# Patient Record
Sex: Female | Born: 1980 | Race: Black or African American | Hispanic: No | Marital: Single | Smoking: Never smoker
Health system: Southern US, Community
[De-identification: ages and names within clinical notes are randomized; demographics above are authoritative.]

## PROBLEM LIST (undated history)

## (undated) DIAGNOSIS — D649 Anemia, unspecified: Secondary | ICD-10-CM

## (undated) DIAGNOSIS — D219 Benign neoplasm of connective and other soft tissue, unspecified: Secondary | ICD-10-CM

## (undated) DIAGNOSIS — Z973 Presence of spectacles and contact lenses: Secondary | ICD-10-CM

## (undated) HISTORY — DX: Anemia, unspecified: D64.9

## (undated) HISTORY — PX: MYOMECTOMY: SHX85

---

## 2012-01-24 DIAGNOSIS — N920 Excessive and frequent menstruation with regular cycle: Secondary | ICD-10-CM | POA: Insufficient documentation

## 2015-02-20 ENCOUNTER — Encounter: Payer: Self-pay | Admitting: *Deleted

## 2015-02-24 NOTE — Discharge Instructions (Signed)
[facility] °[facility] ° °POST OPERATIVE INSTRUCTIONS FOR DR. TROXLER AND DR. FOWLER °[facility] ° ° °1. Take your medication as prescribed.  Pain medication should be taken only as needed. ° °2. Keep the dressing clean, dry and intact. ° °3. Keep your foot elevated above the heart level for the first 48 hours. ° °4. Walking to the bathroom and brief periods of walking are acceptable, unless we have instructed you to be non-weight bearing. ° °5. Always wear your post-op shoe when walking.  Always use your crutches if you are to be non-weight bearing. ° °6. Do not take a shower. Baths are permissible as long as the foot is kept out of the water.  ° °7. Every hour you are awake:  °- Bend your knee 15 times. °- Flex foot 15 times °- Massage calf 15 times ° °8. Call [facility] ([phone]) if any of the following problems occur: °- You develop a temperature or fever. °- The bandage becomes saturated with blood. °- Medication does not stop your pain. °- Injury of the foot occurs. °- Any symptoms of infection including redness, odor, or red streaks running from wound. ° °General Anesthesia, Adult, Care After °Refer to this sheet in the next few weeks. These instructions provide you with information on caring for yourself after your procedure. Your health care provider may also give you more specific instructions. Your treatment has been planned according to current medical practices, but problems sometimes occur. Call your health care provider if you have any problems or questions after your procedure. °WHAT TO EXPECT AFTER THE PROCEDURE °After the procedure, it is typical to experience: °· Sleepiness. °· Nausea and vomiting. °HOME CARE INSTRUCTIONS °· For the first 24 hours after general anesthesia: °¨ Have a responsible person with you. °¨ Do not drive a car. If you are alone, do not take public transportation. °¨ Do not drink alcohol. °¨ Do not take  medicine that has not been prescribed by your health care provider. °¨ Do not sign important papers or make important decisions. °¨ You may resume a normal diet and activities as directed by your health care provider. °· Change bandages (dressings) as directed. °· If you have questions or problems that seem related to general anesthesia, call the hospital and ask for the anesthetist or anesthesiologist on call. °SEEK MEDICAL CARE IF: °· You have nausea and vomiting that continue the day after anesthesia. °· You develop a rash. °SEEK IMMEDIATE MEDICAL CARE IF:  °· You have difficulty breathing. °· You have chest pain. °· You have any allergic problems. °  °This information is not intended to replace advice given to you by your health care provider. Make sure you discuss any questions you have with your health care provider. °  °Document Released: 05/16/2000 Document Revised: 02/28/2014 Document Reviewed: 06/08/2011 °Elsevier Interactive Patient Education ©2016 Elsevier Inc. ° °

## 2015-02-25 ENCOUNTER — Ambulatory Visit: Payer: Worker's Compensation | Admitting: Anesthesiology

## 2015-02-25 ENCOUNTER — Ambulatory Visit
Admission: RE | Admit: 2015-02-25 | Discharge: 2015-02-25 | Disposition: A | Discharge status: Home / Self Care | Payer: Worker's Compensation | Source: Ambulatory Visit | Attending: Podiatry | Admitting: Podiatry

## 2015-02-25 ENCOUNTER — Encounter
Admission: RE | Disposition: A | Discharge status: Home / Self Care | Payer: Self-pay | Source: Ambulatory Visit | Attending: Podiatry

## 2015-02-25 DIAGNOSIS — Y939 Activity, unspecified: Secondary | ICD-10-CM | POA: Diagnosis not present

## 2015-02-25 DIAGNOSIS — J3081 Allergic rhinitis due to animal (cat) (dog) hair and dander: Secondary | ICD-10-CM | POA: Diagnosis not present

## 2015-02-25 DIAGNOSIS — Z79899 Other long term (current) drug therapy: Secondary | ICD-10-CM | POA: Insufficient documentation

## 2015-02-25 DIAGNOSIS — X58XXXA Exposure to other specified factors, initial encounter: Secondary | ICD-10-CM | POA: Insufficient documentation

## 2015-02-25 DIAGNOSIS — S82832A Other fracture of upper and lower end of left fibula, initial encounter for closed fracture: Secondary | ICD-10-CM | POA: Insufficient documentation

## 2015-02-25 HISTORY — DX: Presence of spectacles and contact lenses: Z97.3

## 2015-02-25 HISTORY — PX: ORIF ANKLE FRACTURE: SHX5408

## 2015-02-25 SURGERY — OPEN REDUCTION INTERNAL FIXATION (ORIF) ANKLE FRACTURE
Anesthesia: Regional | Site: Ankle | Laterality: Left | Wound class: Clean

## 2015-02-25 MED ORDER — ONDANSETRON HCL 4 MG PO TABS: 4.0000 mg | ORAL_TABLET | Freq: Four times a day (QID) | ORAL | Status: DC | PRN

## 2015-02-25 MED ORDER — ONDANSETRON HCL 4 MG/2ML IJ SOLN
INTRAMUSCULAR | Status: DC | PRN
  Administered 2015-02-25: 4 mg via INTRAVENOUS

## 2015-02-25 MED ORDER — HYDROMORPHONE HCL 1 MG/ML IJ SOLN: 0.2500 mg | INTRAMUSCULAR | Status: DC | PRN

## 2015-02-25 MED ORDER — ONDANSETRON HCL 4 MG/2ML IJ SOLN: 4.0000 mg | Freq: Once | INTRAMUSCULAR | Status: DC | PRN

## 2015-02-25 MED ORDER — HYDROCODONE-ACETAMINOPHEN 5-325 MG PO TABS: 1.0000 | ORAL_TABLET | ORAL | Status: DC | PRN

## 2015-02-25 MED ORDER — ACETAMINOPHEN 325 MG PO TABS: 325.0000 mg | ORAL_TABLET | ORAL | Status: DC | PRN

## 2015-02-25 MED ORDER — LACTATED RINGERS IV SOLN
INTRAVENOUS | Status: DC
  Administered 2015-02-25 (×2): via INTRAVENOUS

## 2015-02-25 MED ORDER — DEXAMETHASONE SODIUM PHOSPHATE 4 MG/ML IJ SOLN
INTRAMUSCULAR | Status: DC | PRN
  Administered 2015-02-25: 4 mg via PERINEURAL
  Administered 2015-02-25: 8 mg via INTRAVENOUS

## 2015-02-25 MED ORDER — OXYCODONE HCL 5 MG/5ML PO SOLN: 5.0000 mg | Freq: Once | ORAL | Status: AC | PRN

## 2015-02-25 MED ORDER — ACETAMINOPHEN 160 MG/5ML PO SOLN: 325.0000 mg | ORAL | Status: DC | PRN

## 2015-02-25 MED ORDER — OXYCODONE HCL 5 MG PO TABS
5.0000 mg | ORAL_TABLET | Freq: Once | ORAL | Status: AC | PRN
  Administered 2015-02-25: 5 mg via ORAL

## 2015-02-25 MED ORDER — BUPIVACAINE HCL (PF) 0.25 % IJ SOLN
INTRAMUSCULAR | Status: DC | PRN
  Administered 2015-02-25: 10 mL

## 2015-02-25 MED ORDER — MIDAZOLAM HCL 2 MG/2ML IJ SOLN
INTRAMUSCULAR | Status: DC | PRN
  Administered 2015-02-25: 2 mg via INTRAVENOUS

## 2015-02-25 MED ORDER — ONDANSETRON HCL 4 MG/2ML IJ SOLN: 4.0000 mg | Freq: Four times a day (QID) | INTRAMUSCULAR | Status: DC | PRN

## 2015-02-25 MED ORDER — LIDOCAINE HCL (CARDIAC) 20 MG/ML IV SOLN
INTRAVENOUS | Status: DC | PRN
Start: 2015-02-25 — End: 2015-02-25
  Administered 2015-02-25: 40 mg via INTRATRACHEAL

## 2015-02-25 MED ORDER — CEFAZOLIN SODIUM-DEXTROSE 2-3 GM-% IV SOLR
2.0000 g | Freq: Once | INTRAVENOUS | Status: AC
  Administered 2015-02-25: 2 g via INTRAVENOUS

## 2015-02-25 MED ORDER — PROPOFOL 10 MG/ML IV BOLUS
INTRAVENOUS | Status: DC | PRN
  Administered 2015-02-25: 200 mg via INTRAVENOUS

## 2015-02-25 MED ORDER — ROPIVACAINE HCL 5 MG/ML IJ SOLN
INTRAMUSCULAR | Status: DC | PRN
  Administered 2015-02-25: 15 mL via EPIDURAL
  Administered 2015-02-25: 35 mL via PERINEURAL

## 2015-02-25 MED ORDER — FENTANYL CITRATE (PF) 100 MCG/2ML IJ SOLN
INTRAMUSCULAR | Status: DC | PRN
  Administered 2015-02-25 (×2): 50 ug via INTRAVENOUS
  Administered 2015-02-25: 100 ug via INTRAVENOUS

## 2015-02-25 SURGICAL SUPPLY — 48 items
BAG SNAP BAND KOVER 36X36 (MISCELLANEOUS) ×2 IMPLANT
BANDAGE ELASTIC 4 CLIP NS LF (GAUZE/BANDAGES/DRESSINGS) ×2 IMPLANT
BIT DRILL 2.5X2.75 QC CALB (BIT) ×2 IMPLANT
BIT DRILL CALIBRATED 2.7 (BIT) ×2 IMPLANT
BNDG ESMARK 4X12 TAN STRL LF (GAUZE/BANDAGES/DRESSINGS) ×2 IMPLANT
BNDG GAUZE 4.5X4.1 6PLY STRL (MISCELLANEOUS) ×2 IMPLANT
BNDG STRETCH 4X75 STRL LF (GAUZE/BANDAGES/DRESSINGS) ×2 IMPLANT
CANISTER SUCT 1200ML W/VALVE (MISCELLANEOUS) ×2 IMPLANT
COVER LIGHT HANDLE UNIVERSAL (MISCELLANEOUS) ×4 IMPLANT
CUFF TOURN SGL QUICK 34 (TOURNIQUET CUFF) ×1
CUFF TRNQT CYL 34X4X40X1 (TOURNIQUET CUFF) ×1 IMPLANT
DRAPE SHEET LG 3/4 BI-LAMINATE (DRAPES) ×2 IMPLANT
DURAPREP 26ML APPLICATOR (WOUND CARE) ×2 IMPLANT
GAUZE PETRO XEROFOAM 1X8 (MISCELLANEOUS) ×2 IMPLANT
GAUZE SPONGE 4X4 12PLY STRL (GAUZE/BANDAGES/DRESSINGS) ×2 IMPLANT
GLOVE BIO SURGEON STRL SZ7.5 (GLOVE) ×4 IMPLANT
GLOVE INDICATOR 8.0 STRL GRN (GLOVE) ×4 IMPLANT
GOWN STRL REUS W/ TWL LRG LVL3 (GOWN DISPOSABLE) ×2 IMPLANT
GOWN STRL REUS W/TWL LRG LVL3 (GOWN DISPOSABLE) ×2
KIT ROOM TURNOVER OR (KITS) ×2 IMPLANT
NS IRRIG 500ML POUR BTL (IV SOLUTION) ×2 IMPLANT
PACK EXTREMITY ARMC (MISCELLANEOUS) ×2 IMPLANT
PAD GROUND ADULT SPLIT (MISCELLANEOUS) ×2 IMPLANT
PLATE LOCK 7H 92 BILAT FIB (Plate) ×2 IMPLANT
SCREW CORTICAL 2.7MM  38MM (Screw) ×1 IMPLANT
SCREW CORTICAL 2.7MM  42MM (Screw) IMPLANT
SCREW CORTICAL 2.7MM 38MM (Screw) ×1 IMPLANT
SCREW CORTICAL 2.7MM 42MM (Screw) IMPLANT
SCREW LOCK CORT STAR 3.5X10 (Screw) ×2 IMPLANT
SCREW LOCK CORT STAR 3.5X12 (Screw) ×4 IMPLANT
SCREW LOCK CORT STAR 3.5X14 (Screw) ×2 IMPLANT
SCREW LOCK CORT STAR 3.5X16 (Screw) ×2 IMPLANT
SCREW LOW PROFILE 18MMX3.5MM (Screw) IMPLANT
SCREW NON LOCKING LP 3.5 16MM (Screw) ×2 IMPLANT
SPONGE LAP 18X18 5 PK (GAUZE/BANDAGES/DRESSINGS) ×2 IMPLANT
STOCKINETTE STRL 6IN 960660 (GAUZE/BANDAGES/DRESSINGS) ×2 IMPLANT
STRAP BODY AND KNEE 60X3 (MISCELLANEOUS) ×4 IMPLANT
SUT ETHILON 3-0 KS 30 BLK (SUTURE) ×4 IMPLANT
SUT ETHILON 4-0 (SUTURE)
SUT ETHILON 4-0 FS2 18XMFL BLK (SUTURE)
SUT ETHILON 5-0 FS-2 18 BLK (SUTURE) IMPLANT
SUT MNCRL+ 5-0 UNDYED PC-3 (SUTURE) IMPLANT
SUT MONOCRYL 5-0 (SUTURE)
SUT VIC AB 2-0 CT2 27 (SUTURE) ×2 IMPLANT
SUT VIC AB 4-0 FS2 27 (SUTURE) ×2 IMPLANT
SUT VICRYL 3-0 27IN (SUTURE) ×2 IMPLANT
SUT VICRYL AB 3-0 FS1 BRD 27IN (SUTURE) IMPLANT
SUTURE ETHLN 4-0 FS2 18XMF BLK (SUTURE) IMPLANT

## 2015-02-25 NOTE — Transfer of Care (Signed)
Immediate Anesthesia Transfer of Care Note  Patient: Sierra Mejia  Procedure(s) Performed: Procedure(s) with comments: OPEN REDUCTION INTERNAL FIXATION (ORIF) ANKLE FRACTURE LEFT FIBULAR (Left) - WITH POPLITEAL  Patient Location: PACU  Anesthesia Type: General, Regional  Level of Consciousness: awake, alert  and patient cooperative  Airway and Oxygen Therapy: Patient Spontanous Breathing and Patient connected to supplemental oxygen  Post-op Assessment: Post-op Vital signs reviewed, Patient's Cardiovascular Status Stable, Respiratory Function Stable, Patent Airway and No signs of Nausea or vomiting  Post-op Vital Signs: Reviewed and stable  Complications: No apparent anesthesia complications

## 2015-02-25 NOTE — Progress Notes (Signed)
Assisted Daniel Kovacs ANMD with left, popliteal/saphenous block. Side rails up, monitors on throughout procedure. See vital signs in flow sheet. Tolerated Procedure well.    

## 2015-02-25 NOTE — Anesthesia Procedure Notes (Addendum)
Anesthesia Regional Block:  Popliteal block  Pre-Anesthetic Checklist: ,, timeout performed, Correct Patient, Correct Site, Correct Laterality, Correct Procedure, Correct Position, site marked, Risks and benefits discussed,  Surgical consent,  Pre-op evaluation,  At surgeon's request and post-op pain management  Laterality: Left  Prep: chloraprep       Needles:  Injection technique: Single-shot     Needle Length: 4cm 4 cm Needle Gauge: 21 and 21 G    Additional Needles:  Procedures: ultrasound guided (picture in chart) Popliteal block Narrative:  Start time: 02/25/2015 11:00 AM End time: 02/25/2015 11:15 AM Injection made incrementally with aspirations every 35 mL.  Performed by: Personally  Anesthesiologist: Marchia Bond D   Anesthesia Regional Block:  Adductor canal block  Pre-Anesthetic Checklist: ,, timeout performed, Correct Patient, Correct Site, Correct Laterality, Correct Procedure, Correct Position, site marked, Risks and benefits discussed,  Surgical consent,  Pre-op evaluation,  At surgeon's request and post-op pain management  Laterality: Left  Prep: chloraprep       Needles:      Needle Length: 4cm 4 cm Needle Gauge: 21 and 21 G    Additional Needles: Adductor canal block Narrative:  Start time: 02/25/2015 11:00 AM End time: 02/25/2015 11:15 AM Injection made incrementally with aspirations every 15 mL.  Performed by: Personally  Anesthesiologist: Marchia Bond D   Procedure Name: LMA Insertion Date/Time: 02/25/2015 12:27 PM Performed by: Londell Moh Pre-anesthesia Checklist: Patient identified, Emergency Drugs available, Suction available, Timeout performed and Patient being monitored Patient Re-evaluated:Patient Re-evaluated prior to inductionOxygen Delivery Method: Circle system utilized Preoxygenation: Pre-oxygenation with 100% oxygen Intubation Type: IV induction LMA: LMA inserted LMA Size: 4.0 Number of attempts: 1 Placement  Confirmation: positive ETCO2 and breath sounds checked- equal and bilateral Tube secured with: Tape

## 2015-02-25 NOTE — Anesthesia Postprocedure Evaluation (Signed)
Anesthesia Post Note  Patient: Sierra Mejia  Procedure(s) Performed: Procedure(s) (LRB): OPEN REDUCTION INTERNAL FIXATION (ORIF) ANKLE FRACTURE LEFT FIBULAR (Left)  Patient location during evaluation: PACU Anesthesia Type: General and Regional Level of consciousness: awake and alert Pain management: pain level controlled Vital Signs Assessment: post-procedure vital signs reviewed and stable Respiratory status: spontaneous breathing, nonlabored ventilation, respiratory function stable and patient connected to nasal cannula oxygen Cardiovascular status: blood pressure returned to baseline and stable Postop Assessment: no signs of nausea or vomiting Anesthetic complications: no    Khalilah Hoke D Alizzon Dioguardi

## 2015-02-25 NOTE — H&P (Signed)
  HISTORY AND PHYSICAL INTERVAL NOTE:  02/25/2015  12:17 PM  Sierra Mejia  has presented today for surgery, with the diagnosis of S82.832A CLOSED FRACTURE DISTAL END OF LEFT FIBULA.  The various methods of treatment have been discussed with the patient.  No guarantees were given.  After consideration of risks, benefits and other options for treatment, the patient has consented to surgery.  I have reviewed the patients' chart and labs.    Patient Vitals for the past 24 hrs:  BP Temp Pulse Resp SpO2 Height Weight  02/25/15 1115 - - 73 17 100 % - -  02/25/15 1110 129/80 mmHg - 74 (!) 21 100 % - -  02/25/15 1105 128/80 mmHg - 75 20 100 % - -  02/25/15 1100 136/84 mmHg - 73 16 100 % - -  02/25/15 1051 (!) 138/92 mmHg 97.7 F (36.5 C) 84 16 100 % 5\' 11"  (1.803 m) 113.399 kg (250 lb)    A history and physical examination was performed in my office.  The patient was reexamined.  There have been no changes to this history and physical examination.Plan for ORIF left fibula fracture.  Samara Deist A

## 2015-02-25 NOTE — Op Note (Signed)
Operative note   Surgeon:Lalah Durango    Assistant:none    Preop diagnosis:Left fibular fracture     Postop diagnosis:Same     Procedure:ORIF left fibular fracture    CH:[id number]    Anesthesia:regional and general    Hemostasis:Thigh and inflated to 325 mmHg for 92 minutes    Specimen: None    Complications: None    Operative indications: 35 year old female sustained a fibular fracture. She noted marked instability with pain and a sensation of case and of the ankle. We discussed nonsurgical and surgical treatment and she presents today for surgery. The risks benefits alternatives and competitions have been discussed and consent has been given.    Procedure:  Patient was brought into the OR and placed on the operating table in thesupine position. After anesthesia was obtained theleft lower extremity was prepped and draped in usual sterile fashion.  Attention was directed to the lateral aspect of the fibula where after inflation of the tourniquet longitudinal incision was made to the distal one third of the fibula. Sharp and blunt dissection carried down to the periosteum. The subperiosteal dissection was then undertaken. Posterior oblique fracture was noted and with bone reduction forceps I was able to realign this. Prior to reduction the ankle joint was x-rayed and there was noted to be a increased medial gap on the medial gutter region. After compression of the fibular fracture and alignment the medial gap was noted to be anatomic. There was also noted to be a secondary large fragment on the anterior aspect of the fibula. Possibly a large exostosis that had fractured. I. The posterior oblique fracture was stabilized with bone reduction clamp and a Biomet composite locking plate was applied to the lateral aspect of the fibula. 3 holes proximal were filled with 2 locking 3.48mm screws and one 3.64mm nonlocking screw. The distal holes were filled with 3.5 mm locking screws. At this time the  large anterior osteophyte fragment was noted and was temporarily reduced with a bone reduction clamp. A 2.7 mm x 38 mm screw was placed from anterior to posterior. Good alignment and stabilization was noted at this time. Multiple views of fluoroscopy revealed anatomic aligned ankle fracture site and the ankle joint itself was anatomically aligned without stability under stress. The wound was then flushed with copious amounts of irrigation. Layered closure was performed with a 2-0 Vicryl and 3-0 Vicryl. The skin was closed with a 3-0 nylon. Patient was then placed in a well compressive bulky sterile dressing and a equalizer walker boot was applied.    Patient tolerated the procedure and anesthesia well.  Was transported from the OR to the PACU with all vital signs stable and vascular status intact. To be discharged per routine protocol.  Will follow up in approximately 1 week in the outpatient clinic.

## 2015-02-25 NOTE — Anesthesia Preprocedure Evaluation (Addendum)
Anesthesia Evaluation  Patient identified by MRN, date of birth, ID band Patient awake    Reviewed: Allergy & Precautions, H&P , NPO status , Patient's Chart, lab work & pertinent test results, reviewed documented beta blocker date and time   Airway Mallampati: II  TM Distance: >3 FB Neck ROM: full    Dental no notable dental hx.    Pulmonary neg pulmonary ROS,    Pulmonary exam normal breath sounds clear to auscultation       Cardiovascular Exercise Tolerance: Good negative cardio ROS   Rhythm:regular Rate:Normal     Neuro/Psych negative neurological ROS  negative psych ROS   GI/Hepatic negative GI ROS, Neg liver ROS,   Endo/Other  negative endocrine ROS  Renal/GU negative Renal ROS  negative genitourinary   Musculoskeletal   Abdominal   Peds  Hematology negative hematology ROS (+)   Anesthesia Other Findings   Reproductive/Obstetrics negative OB ROS                             Anesthesia Physical Anesthesia Plan  ASA: II  Anesthesia Plan: General and Regional   Post-op Pain Management:    Induction:   Airway Management Planned:   Additional Equipment:   Intra-op Plan:   Post-operative Plan:   Informed Consent: I have reviewed the patients History and Physical, chart, labs and discussed the procedure including the risks, benefits and alternatives for the proposed anesthesia with the patient or authorized representative who has indicated his/her understanding and acceptance.     Plan Discussed with: CRNA  Anesthesia Plan Comments:         Anesthesia Quick Evaluation

## 2015-02-26 ENCOUNTER — Encounter: Payer: Self-pay | Admitting: Podiatry

## 2015-11-11 DIAGNOSIS — Z30431 Encounter for routine checking of intrauterine contraceptive device: Secondary | ICD-10-CM | POA: Insufficient documentation

## 2016-08-15 ENCOUNTER — Encounter (HOSPITAL_COMMUNITY): Payer: Self-pay | Admitting: *Deleted

## 2016-08-15 ENCOUNTER — Inpatient Hospital Stay (HOSPITAL_COMMUNITY)
Admission: AD | Admit: 2016-08-15 | Discharge: 2016-08-15 | Disposition: A | Discharge status: Home / Self Care | Payer: 59 | Source: Ambulatory Visit | Attending: Obstetrics and Gynecology | Admitting: Obstetrics and Gynecology

## 2016-08-15 DIAGNOSIS — N939 Abnormal uterine and vaginal bleeding, unspecified: Secondary | ICD-10-CM | POA: Diagnosis not present

## 2016-08-15 DIAGNOSIS — D259 Leiomyoma of uterus, unspecified: Secondary | ICD-10-CM | POA: Diagnosis not present

## 2016-08-15 DIAGNOSIS — R5383 Other fatigue: Secondary | ICD-10-CM | POA: Diagnosis present

## 2016-08-15 DIAGNOSIS — D649 Anemia, unspecified: Secondary | ICD-10-CM | POA: Diagnosis not present

## 2016-08-15 DIAGNOSIS — N926 Irregular menstruation, unspecified: Secondary | ICD-10-CM | POA: Diagnosis present

## 2016-08-15 LAB — URINALYSIS, MICROSCOPIC (REFLEX)
Bacteria, UA: NONE SEEN
RBC / HPF: NONE SEEN RBC/hpf (ref 0–5)

## 2016-08-15 LAB — CBC
HEMATOCRIT: 23.7 % — AB (ref 36.0–46.0)
HEMOGLOBIN: 7 g/dL — AB (ref 12.0–15.0)
MCH: 21.5 pg — AB (ref 26.0–34.0)
MCHC: 29.5 g/dL — ABNORMAL LOW (ref 30.0–36.0)
MCV: 72.7 fL — AB (ref 78.0–100.0)
Platelets: 410 10*3/uL — ABNORMAL HIGH (ref 150–400)
RBC: 3.26 MIL/uL — ABNORMAL LOW (ref 3.87–5.11)
RDW: 18.7 % — ABNORMAL HIGH (ref 11.5–15.5)
WBC: 4.1 10*3/uL (ref 4.0–10.5)

## 2016-08-15 LAB — URINALYSIS, ROUTINE W REFLEX MICROSCOPIC
Bilirubin Urine: NEGATIVE
GLUCOSE, UA: NEGATIVE mg/dL
Ketones, ur: NEGATIVE mg/dL
Leukocytes, UA: NEGATIVE
Nitrite: NEGATIVE
Protein, ur: NEGATIVE mg/dL
SPECIFIC GRAVITY, URINE: 1.02 (ref 1.005–1.030)
pH: 6 (ref 5.0–8.0)

## 2016-08-15 LAB — POCT PREGNANCY, URINE: Preg Test, Ur: NEGATIVE

## 2016-08-15 MED ORDER — SODIUM CHLORIDE 0.9 % IV SOLN
510.0000 mg | INTRAVENOUS | Status: DC
  Filled 2016-08-15: qty 17

## 2016-08-15 MED ORDER — MEGESTROL ACETATE 40 MG PO TABS: 40.0000 mg | ORAL_TABLET | Freq: Two times a day (BID) | ORAL | 5 refills | Status: DC

## 2016-08-15 MED ORDER — TRANEXAMIC ACID 650 MG PO TABS: 1300.0000 mg | ORAL_TABLET | Freq: Three times a day (TID) | ORAL | 2 refills | Status: DC

## 2016-08-15 MED ORDER — FERROUS SULFATE 325 (65 FE) MG PO TABS: 325.0000 mg | ORAL_TABLET | Freq: Three times a day (TID) | ORAL | 3 refills | Status: DC

## 2016-08-15 NOTE — MAU Provider Note (Addendum)
[facility] Attending Note  History     CSN: [id number]  Arrival date & time 08/15/16  1761   First Provider Initiated Contact with Patient 08/15/16 2207      Chief Complaint  Patient presents with  . Fatigue    Sierra Mejia is a 36 y.o. G0P0000 who presents to MAU today for evaluation of "feeling anemic". Reports long history of fibroids and AUB; had one transfusion in the past when her hemoglobin was 5.  Had myomectomy in [place] in 2014. No current GYN doctor, moved to [place] 2 years ago. Has been seen by GYN in [place], declined surgical management.  Has heavy, irregular periods, last one ended yesterday. No current bleeding. Endorses lightheadedness, and fatigue.  Denies any abnormal vaginal discharge, fevers, chills, sweats, dysuria, nausea, vomiting, other GI or GU symptoms or other general symptoms.   Obstetric History   G0   P0   T0   P0   A0   L0    SAB0   TAB0   Ectopic0   Multiple0   Live Births0       Past Medical History:  Diagnosis Date  . Wears contact lenses     Past Surgical History:  Procedure Laterality Date  . MYOMECTOMY    . ORIF ANKLE FRACTURE Left 02/25/2015   Procedure: OPEN REDUCTION INTERNAL FIXATION (ORIF) ANKLE FRACTURE LEFT FIBULAR;  Surgeon: Samara Deist, DPM;  Location: [facility];  Service: Podiatry;  Laterality: Left;  WITH POPLITEAL    No family history on file.  Social History  Substance Use Topics  . Smoking status: Never Smoker  . Smokeless tobacco: Not on file  . Alcohol use No    No Known Allergies  No prescriptions prior to admission.     Physical Exam  BP 122/75 (BP Location: Right Arm)   Pulse 76   Temp 98 F (36.7 C) (Oral)   Resp 18   Ht 5\' 11"  (1.803 m)   Wt 267 lb 4 oz (121.2 kg)   LMP 08/09/2016   BMI 37.27 kg/m  GENERAL: Well-developed, well-nourished female in no acute distress  SKIN: Warm, dry and without erythema PSYCH: Normal mood and affect HEENT: Normocephalic,  atraumatic.   LUNGS: Normal respiratory effort, normal breath sounds HEART: Regular rate noted ABDOMEN: Soft, nondistended, nontender, enlarged fibroid uterus  PELVIC: Deferred EXTREMITIES: No edema, no cyanosis, normal range of movement  MAU Course/MDM  No intervention given in MAU  Labs and Imaging   12/11/2015 at [facility] ADDITIONAL CLINICAL INFORMATION: Uterine fibroids COMPARISON: MRI February 2017 INTERPRETATION:  The uterus is enlarged measured at 9.2 x 14.4 x 20.6 cm. Multiple fibroids are noted throughout the uterus with the largest measuring 9.1 cm.  The endometrial stripe is normal in thickness. Right ovary is not visualized. Left ovary is not visualized. There is no significant free fluid. IMPRESSION:  Enlarged fibroid uterus.     Results for orders placed or performed during the hospital encounter of 08/15/16 (from the past 24 hour(s))  Urinalysis, Routine w reflex microscopic     Status: Abnormal   Collection Time: 08/15/16  8:19 PM  Result Value Ref Range   Color, Urine YELLOW YELLOW   APPearance CLEAR CLEAR   Specific Gravity, Urine 1.020 1.005 - 1.030   pH 6.0 5.0 - 8.0   Glucose, UA NEGATIVE NEGATIVE mg/dL   Hgb urine dipstick TRACE (A) NEGATIVE   Bilirubin Urine NEGATIVE NEGATIVE  Ketones, ur NEGATIVE NEGATIVE mg/dL   Protein, ur NEGATIVE NEGATIVE mg/dL   Nitrite NEGATIVE NEGATIVE   Leukocytes, UA NEGATIVE NEGATIVE  Urinalysis, Microscopic (reflex)     Status: Abnormal   Collection Time: 08/15/16  8:19 PM  Result Value Ref Range   RBC / HPF NONE SEEN 0 - 5 RBC/hpf   WBC, UA 0-5 0 - 5 WBC/hpf   Bacteria, UA NONE SEEN NONE SEEN   Squamous Epithelial / LPF 0-5 (A) NONE SEEN   Mucous PRESENT   Pregnancy, urine POC     Status: None   Collection Time: 08/15/16  8:22 PM  Result Value Ref Range   Preg Test, Ur NEGATIVE NEGATIVE  CBC     Status: Abnormal   Collection Time: 08/15/16  8:25 PM  Result  Value Ref Range   WBC 4.1 4.0 - 10.5 K/uL   RBC 3.26 (L) 3.87 - 5.11 MIL/uL   Hemoglobin 7.0 (L) 12.0 - 15.0 g/dL   HCT 23.7 (L) 36.0 - 46.0 %   MCV 72.7 (L) 78.0 - 100.0 fL   MCH 21.5 (L) 26.0 - 34.0 pg   MCHC 29.5 (L) 30.0 - 36.0 g/dL   RDW 18.7 (H) 11.5 - 15.5 %   Platelets 410 (H) 150 - 400 K/uL   No results found.  Assessment and Plan   1. Anemia, unspecified type   2. Abnormal uterine bleeding (AUB)   3. Uterine leiomyoma, unspecified location    Patient is anemic with a hemoglobin of 7.0 and symptomatic. Offered transfusion, she declined. Discussed outpatient IV iron infusion; she agreed to this.  Orders were signed and held, and I scheduled an appointment with the [facility], [place] 3rd floor) at 11 am. Ordered two weekly doses of Feraheme.  Patient agreed with this plan.  Work letter was given to her.   As per long term management,  aad a long discussion about fertility sparing management options for abnormal uterine bleeding including tranexamic acid (Lysteda), oral progesterone (Megace), Depo Provera, Mirena IUD, or myomectomy for surgical management.  Discussed risks and benefits of each method.   Patient desires Marshell Levan for now, considering Megace.  Lysteda and Megace prescribed as needed for now,  bleeding precautions reviewed.     Was told to return to MAU for any pain, bleeding or other concerns, or if her condition were to change or worsen. Was also given list of GYN providers and told to establish care ASAP.  Discharged to home in stable condition   Allergies as of 08/15/2016   No Known Allergies     Medication List    TAKE these medications   ferrous sulfate 325 (65 FE) MG tablet Take 1 tablet (325 mg total) by mouth 3 (three) times daily with meals. What changed:  when to take this   HYDROcodone-acetaminophen 5-325 MG tablet Commonly known as:  NORCO/VICODIN Take 2 tablets by mouth every 6 (six) hours as needed for  moderate pain.   megestrol 40 MG tablet Commonly known as:  MEGACE Take 1 tablet (40 mg total) by mouth 2 (two) times daily. Can increase to two tablets twice a day in the event of heavy bleeding   tranexamic acid 650 MG Tabs tablet Commonly known as:  LYSTEDA Take 2 tablets (1,300 mg total) by mouth 3 (three) times daily. Take during menses for a maximum of five days        Verita Schneiders, MD, [facility] Attending Obstetrician &  Gynecologist, Product/process development scientist for [employer], [place]

## 2016-08-15 NOTE — MAU Note (Signed)
PT  SAYS SHE FEELS  WEAK  AND  HAS ANEMIA  AND FIBROIDS.    HER CYCLE-  HAS BEEN X2 IN  April AND  MAY .   HER DR IN [place] TOLD HER THIS.  HAS DR IN [place]-  HAD SURGERY IN 2014- [place]   .   NO BIRTH CONTROL.  LAST SEX-  1 MTH AGO.

## 2016-08-15 NOTE — Discharge Instructions (Signed)
[place] for [employer] at [facility]       Phone: [phone]  [place] for [employer] at [place] Phone: [place] for [employer] at [place]  Phone: [place] for [employer] at [employer]  Phone: [place] for [place] at [facility]  Phone: [facility]       Phone: [phone]  [facility]    Phone: [phone]   [facility])    Phone: [facility]    Phone: [phone]  [facility]    Phone: [phone]  [place]    Phone: [phone]  [place] [facility]       Phone: [phone]   [place] [facility]-M[facility]  Phone: [place]    Phone: [phone]  [facility]   Phone: [phone]  [facility]      Phone: [phone]  [facility]    Phone: [phone]     Abnormal Uterine Bleeding Abnormal uterine bleeding can affect women at various stages in life, including teenagers, women in their reproductive years, pregnant women, and women who have reached menopause. Several kinds of uterine bleeding are considered abnormal, including:  Bleeding or spotting between periods.  Bleeding after sexual intercourse.  Bleeding that is heavier or more than normal.  Periods that last longer than usual.  Bleeding after menopause.  Many cases of abnormal uterine bleeding are minor and simple to treat, while others are more serious. Any type of abnormal bleeding should be evaluated by your health care provider. Treatment will depend on the cause of the bleeding. Follow these instructions at home: Monitor your condition for any changes. The following actions may help  to alleviate any discomfort you are experiencing:  Avoid the use of tampons and douches as directed by your health care provider.  Change your pads frequently.  You should get regular pelvic exams and Pap tests. Keep all follow-up appointments for diagnostic tests as directed by your health care provider. Contact a health care provider if:  Your bleeding lasts more than 1 week.  You feel dizzy at times. Get help right away if:  You pass out.  You are changing pads every 15 to 30 minutes.  You have abdominal pain.  You have a fever.  You become sweaty or weak.  You are passing large blood clots from the vagina.  You start to feel nauseous and vomit. This information is not intended to replace advice given to you by your health care provider. Make sure you discuss any questions you have with your health care provider. Document Released: 02/07/2005 Document Revised: 07/22/2015 Document Reviewed: 09/06/2012 Elsevier Interactive Patient Education  2017 [employer].

## 2016-08-17 ENCOUNTER — Ambulatory Visit (HOSPITAL_COMMUNITY)
Admission: RE | Admit: 2016-08-17 | Discharge: 2016-08-17 | Disposition: A | Discharge status: Home / Self Care | Payer: 59 | Source: Ambulatory Visit | Attending: Obstetrics and Gynecology | Admitting: Obstetrics and Gynecology

## 2016-08-17 ENCOUNTER — Other Ambulatory Visit: Payer: Self-pay | Admitting: Obstetrics & Gynecology

## 2016-08-17 DIAGNOSIS — N938 Other specified abnormal uterine and vaginal bleeding: Secondary | ICD-10-CM | POA: Insufficient documentation

## 2016-08-17 DIAGNOSIS — D500 Iron deficiency anemia secondary to blood loss (chronic): Secondary | ICD-10-CM | POA: Diagnosis present

## 2016-08-17 DIAGNOSIS — D259 Leiomyoma of uterus, unspecified: Secondary | ICD-10-CM | POA: Diagnosis not present

## 2016-08-17 MED ORDER — SODIUM CHLORIDE 0.9 % IV SOLN
510.0000 mg | INTRAVENOUS | Status: DC
  Administered 2016-08-17: 510 mg via INTRAVENOUS
  Filled 2016-08-17: qty 17

## 2016-08-17 NOTE — Progress Notes (Signed)
Diagnosis: Unspecific anemia  Provider: Osborne Oman, MD  Procedure: Pt received a bag of Feraheme over 15 mins.  Pt tolerated well.  Post procedure: Pt was accessed for 76mins following the Feraheme. V/S were stable. D/C instructions given with verbal understanding. Pt was alert, oriented and ambulatory at discharge.

## 2016-08-22 DIAGNOSIS — D259 Leiomyoma of uterus, unspecified: Secondary | ICD-10-CM | POA: Insufficient documentation

## 2016-08-22 DIAGNOSIS — N920 Excessive and frequent menstruation with regular cycle: Secondary | ICD-10-CM | POA: Diagnosis present

## 2016-09-28 ENCOUNTER — Encounter: Payer: Self-pay | Admitting: Obstetrics & Gynecology

## 2017-01-24 ENCOUNTER — Other Ambulatory Visit: Payer: Self-pay | Admitting: Obstetrics & Gynecology

## 2017-09-22 ENCOUNTER — Inpatient Hospital Stay: Payer: BLUE CROSS/BLUE SHIELD | Attending: Nurse Practitioner | Admitting: Nurse Practitioner

## 2017-09-22 ENCOUNTER — Inpatient Hospital Stay: Payer: BLUE CROSS/BLUE SHIELD

## 2017-09-22 ENCOUNTER — Telehealth: Payer: Self-pay | Admitting: Hematology

## 2017-09-22 ENCOUNTER — Encounter: Payer: Self-pay | Admitting: Nurse Practitioner

## 2017-09-22 DIAGNOSIS — D508 Other iron deficiency anemias: Secondary | ICD-10-CM | POA: Insufficient documentation

## 2017-09-22 DIAGNOSIS — D259 Leiomyoma of uterus, unspecified: Secondary | ICD-10-CM | POA: Diagnosis not present

## 2017-09-22 DIAGNOSIS — D500 Iron deficiency anemia secondary to blood loss (chronic): Secondary | ICD-10-CM

## 2017-09-22 DIAGNOSIS — N920 Excessive and frequent menstruation with regular cycle: Secondary | ICD-10-CM | POA: Insufficient documentation

## 2017-09-22 LAB — CBC WITH DIFFERENTIAL (CANCER CENTER ONLY)
BASOS ABS: 0.1 10*3/uL (ref 0.0–0.1)
Basophils Relative: 2 %
EOS PCT: 1 %
Eosinophils Absolute: 0 10*3/uL (ref 0.0–0.5)
HCT: 33.8 % — ABNORMAL LOW (ref 34.8–46.6)
Hemoglobin: 10 g/dL — ABNORMAL LOW (ref 11.6–15.9)
LYMPHS PCT: 34 %
Lymphs Abs: 1.1 10*3/uL (ref 0.9–3.3)
MCH: 24.4 pg — AB (ref 25.1–34.0)
MCHC: 29.6 g/dL — AB (ref 31.5–36.0)
MCV: 82.4 fL (ref 79.5–101.0)
MONO ABS: 0.4 10*3/uL (ref 0.1–0.9)
MONOS PCT: 11 %
Neutro Abs: 1.7 10*3/uL (ref 1.5–6.5)
Neutrophils Relative %: 52 %
PLATELETS: 381 10*3/uL (ref 145–400)
RBC: 4.1 MIL/uL (ref 3.70–5.45)
RDW: 21.8 % — AB (ref 11.2–14.5)
WBC Count: 3.3 10*3/uL — ABNORMAL LOW (ref 3.9–10.3)

## 2017-09-22 NOTE — Telephone Encounter (Signed)
Appointments scheduled AVS/Calendar printed per 8/2 los °

## 2017-09-22 NOTE — Progress Notes (Addendum)
Sierra Mejia  Telephone:([phone]) [phone] Fax:([phone]) [phone]  Clinic New consult Note   Patient Care Team: Patient, No Pcp Per as PCP - General (General Practice) 09/22/2017  CHIEF COMPLAINTS/PURPOSE OF CONSULTATION:  Requested by Dr. Leo Grosser for anemia   HISTORY OF PRESENTING ILLNESS:  Sierra Mejia 37 y.o. female is here because of low hemoglobin is medical history significant for iron deficiency anemia secondary to menorrhagia and uterine fibroids. Patient reports she has been anemic for 5 years.  Per outside records Hgb on 08/15/2016 of 7 0; on 08/28/2017 Hgb is 7.7.  No previous iron studies are available for my review.  She reportedly had one blood transfusion in 2015 for hemoglobin of 5 and has had one IV iron infusion which she tolerated well 1 year ago.  Has never donated blood.  She takes her sulfate 2 tabs per day for the last 5 years.  She tolerates oral iron without constipation or obvious side effects.  She denies anticoagulant use.  She uses NSAIDs during menstrual cycles otherwise none.  She denies other obvious sources of bleeding such as hematochezia, epistaxis, or hematuria.  She denies creased fatigue, recent chest pain on exertion, shortness of breath on minimal exertion, pre-syncopal episodes, or palpitations.  She is up-to-date on Pap smear, has not had mammogram or colonoscopy yet. She had no prior history or diagnosis of cancer or blood disorder. She denies any pica and eats a variety of diet.  She has no significant past medical history other than iron deficiency anemia and uterine fibroids.  She had myomectomy in 2014.  Menarche at age 89, LMP 08/10/2017.  Cycles last 5 days and occur monthly.  She has heavy bleeding for the first 2 days, changes hygiene product every 1.5 hours.  Has had heavy menses for 5 years.   She denies unintentional weight loss, increased fatigue, night sweats, fever.  Appetite is normal.  Denies nausea, burning, constipation, or diarrhea.   No recent fever or chills, chest pain, dyspnea, or cough.  MEDICAL HISTORY:  Past Medical History:  Diagnosis Date  . Anemia   . Wears contact lenses     SURGICAL HISTORY: Past Surgical History:  Procedure Laterality Date  . MYOMECTOMY    . ORIF ANKLE FRACTURE Left 02/25/2015   Procedure: OPEN REDUCTION INTERNAL FIXATION (ORIF) ANKLE FRACTURE LEFT FIBULAR;  Surgeon: Samara Deist, DPM;  Location: [facility];  Service: Podiatry;  Laterality: Left;  WITH POPLITEAL    SOCIAL HISTORY: Social History   Socioeconomic History  . Marital status: Single    Spouse name: Not on file  . Number of children: 0  . Years of education: Not on file  . Highest education level: Not on file  Occupational History  . Not on file  Social Needs  . Financial resource strain: Not on file  . Food insecurity:    Worry: Not on file    Inability: Not on file  . Transportation needs:    Medical: Not on file    Non-medical: Not on file  Tobacco Use  . Smoking status: Never Smoker  . Smokeless tobacco: Never Used  Substance and Sexual Activity  . Alcohol use: Yes    Comment: social   . Drug use: Not Currently  . Sexual activity: Not on file  Lifestyle  . Physical activity:    Days per week: Not on file    Minutes per session: Not on file  . Stress: Not on file  Relationships  .  Social connections:    Talks on phone: Not on file    Gets together: Not on file    Attends religious service: Not on file    Active member of club or organization: Not on file    Attends meetings of clubs or organizations: Not on file    Relationship status: Not on file  . Intimate partner violence:    Fear of current or ex partner: Not on file    Emotionally abused: Not on file    Physically abused: Not on file    Forced sexual activity: Not on file  Other Topics Concern  . Not on file  Social History Narrative  . Not on file    FAMILY HISTORY: Family History  Problem Relation Age of Onset  .  Hypertension Father   . Cancer Neg Hx     ALLERGIES:  has No Known Allergies.  MEDICATIONS:  Current Outpatient Medications  Medication Sig Dispense Refill  . ferrous sulfate 325 (65 FE) MG tablet Take 1 tablet (325 mg total) by mouth 3 (three) times daily with meals. 90 tablet 3   No current facility-administered medications for this visit.     REVIEW OF SYSTEMS:   Constitutional: Denies fevers, chills, unintentional weight loss, or abnormal night sweats (+) fatigue at baseline Ears, nose, mouth, throat, and face: Denies mucositis, epistaxis, or sore throat Respiratory: Denies cough, dyspnea or wheezes Cardiovascular: Denies palpitation, chest discomfort or lower extremity swelling Gastrointestinal:  Denies nausea, vomiting, constipation, diarrhea, hematochezia, heartburn or change in bowel habits Skin: Denies abnormal skin rashes Lymphatics: Denies new lymphadenopathy or easy bruising All other systems were reviewed with the patient and are negative.  PHYSICAL EXAMINATION: ECOG PERFORMANCE STATUS: 0 - Asymptomatic Weight to 291.5 pounds BP 150/92 pulse 69 respirations 18 temp 98.4 O2 sat 100%  GENERAL:alert, no distress and comfortable SKIN: skin color, texture, turgor are normal EYES: sclera clear OROPHARYNX: No thrush or ulcers LYMPH:  no palpable cervical or supraclavicular lymphadenopathy LUNGS: Distant breath sounds, normal breathing effort HEART: regular rate & rhythm, murmur; no lower extremity edema ABDOMEN:abdomen soft, non-tender and normal bowel sounds Musculoskeletal:no cyanosis of digits and no clubbing  PSYCH: alert & oriented x 3 with fluent speech NEURO: no focal motor/sensory deficits  LABORATORY DATA:  I have reviewed the data as listed CBC Latest Ref Rng & Units 09/22/2017 08/15/2016  WBC 3.9 - 10.3 K/uL 3.3(L) 4.1  Hemoglobin 11.6 - 15.9 g/dL 10.0(L) 7.0(L)  Hematocrit 34.8 - 46.6 % 33.8(L) 23.7(L)  Platelets 145 - 400 K/uL 381 410(H)     RADIOGRAPHIC STUDIES: I have personally reviewed the radiological images as listed and agreed with the findings in the report. No results found.  ASSESSMENT & PLAN: [name] is a 37 year old AAF with no significant past medical history referred for worsening anemia with history of iron deficiency anemia  1.  Anemia, secondary to chronic blood loss -We reviewed her medical record in detail with the patient -She has a 5-year history of anemia of iron deficiency secondary to menorrhagia and known large uterine fibroid; she required 1 blood transfusion and one dose of IV iron in the past which she tolerated well.   -She continues oral ferrous sulfate twice daily. -Hgb 1 month ago 7.7, hemoglobin up to 10 on CBC today; iron study is pending  -We discussed potential other sources of bleeding including microscopic blood loss in the GI tract.  She has agreed FOBT today which was provided to her, will return at  next visit -If her iron levels and hemoglobin improve with adequate iron supplementation, we likely do not need to pursue further testing at this time. -We discussed dietary sources of high iron including red meat -She will follow-up with her OB/GYN next week to discuss surgical options related to her uterine fibroid -We will arrange for her to start IV Feraheme in 1 and 2 weeks, will monitor labs closely monthly x3, and follow-up in 3 months  Plan: -Labs today -FOBT, stool card provided -IV Feraheme in 1 and 2 weeks -Lab monthly x3 -Follow up in 3 months -F/U with GYN as scheduled  Orders Placed This Encounter  Procedures  . CBC with Differential (Cancer Center Only)    Standing Status:   Future    Number of Occurrences:   1    Standing Expiration Date:   09/23/2018  . Iron and TIBC    Standing Status:   Future    Number of Occurrences:   1    Standing Expiration Date:   09/23/2018  . Ferritin    Standing Status:   Future    Number of Occurrences:   1    Standing  Expiration Date:   09/23/2018  . Occult blood card to lab, stool    Standing Status:   Future    Standing Expiration Date:   09/23/2018  . Occult blood card to lab, stool    Standing Status:   Future    Standing Expiration Date:   09/23/2018  . Occult blood card to lab, stool    Standing Status:   Future    Number of Occurrences:   1    Standing Expiration Date:   09/23/2018    All questions were answered. The patient knows to call the clinic with any problems, questions or concerns. I spent 45 minutes counseling the patient face to face. The total time spent in the appointment was 60 minutes and more than 50% was on counseling.     Alla Feeling, NP 09/22/17 3:16 PM   Addendum  I have seen the patient, examined her. I agree with the assessment and and plan and have edited the notes.   I have reviewed her previous lab results and medical records. She has chronic anemia and iron deficiency, previous required blood transfusion once and IV iron once about 1 year ago.  She has menorrhagia from fibroid. This is likely the cause of her IDA. She is going to discuss with her gynecologist about treatment options.  No other clinical signs of bleeding.  I will check her stool OB to rule out occlude bleeding also. Due to her moderate anemia, I recommend IV feraheme if she still has significant iron deficiency and anemia after oral iron. Will monitor her lab closely. If her anemia resolves after adequate iron replacement, she would not need other anemia workup.   Truitt Merle  09/22/2017

## 2017-09-25 LAB — IRON AND TIBC
IRON: 15 ug/dL — AB (ref 41–142)
Saturation Ratios: 4 % — ABNORMAL LOW (ref 21–57)
TIBC: 341 ug/dL (ref 236–444)
UIBC: 326 ug/dL

## 2017-09-25 LAB — FERRITIN: FERRITIN: 12 ng/mL (ref 11–307)

## 2017-09-26 ENCOUNTER — Other Ambulatory Visit: Payer: Self-pay | Admitting: Nurse Practitioner

## 2017-09-26 ENCOUNTER — Inpatient Hospital Stay: Payer: BLUE CROSS/BLUE SHIELD

## 2017-09-26 VITALS — BP 132/83 | HR 63 | Temp 98.8°F | Resp 18

## 2017-09-26 DIAGNOSIS — D500 Iron deficiency anemia secondary to blood loss (chronic): Secondary | ICD-10-CM

## 2017-09-26 DIAGNOSIS — D508 Other iron deficiency anemias: Secondary | ICD-10-CM | POA: Diagnosis not present

## 2017-09-26 LAB — OCCULT BLOOD X 1 CARD TO LAB, STOOL: Fecal Occult Bld: NEGATIVE

## 2017-09-26 MED ORDER — SODIUM CHLORIDE 0.9 % IV SOLN
Freq: Once | INTRAVENOUS | Status: AC
  Administered 2017-09-26: 16:00:00 via INTRAVENOUS
  Filled 2017-09-26: qty 250

## 2017-09-26 MED ORDER — SODIUM CHLORIDE 0.9 % IV SOLN: 510.0000 mg | Freq: Once | INTRAVENOUS | Status: DC

## 2017-09-26 MED ORDER — SODIUM CHLORIDE 0.9 % IV SOLN
510.0000 mg | Freq: Once | INTRAVENOUS | Status: AC
  Administered 2017-09-26: 510 mg via INTRAVENOUS
  Filled 2017-09-26: qty 17

## 2017-09-26 NOTE — Patient Instructions (Signed)

## 2017-09-28 ENCOUNTER — Telehealth: Payer: Self-pay

## 2017-09-28 NOTE — Telephone Encounter (Signed)
-----   Message from Alla Feeling, NP sent at 09/27/2017  3:44 PM EDT ----- Please let her know stool test was negative for blood. Thanks, Regan Rakers NP

## 2017-09-28 NOTE — Telephone Encounter (Signed)
Left voice message for patient that stool test was negative for blood, per Cira Rue NP

## 2017-10-03 ENCOUNTER — Inpatient Hospital Stay: Payer: BLUE CROSS/BLUE SHIELD

## 2017-10-03 VITALS — BP 126/73 | HR 59 | Temp 98.5°F | Resp 18

## 2017-10-03 DIAGNOSIS — D500 Iron deficiency anemia secondary to blood loss (chronic): Secondary | ICD-10-CM

## 2017-10-03 DIAGNOSIS — D508 Other iron deficiency anemias: Secondary | ICD-10-CM | POA: Diagnosis not present

## 2017-10-03 MED ORDER — SODIUM CHLORIDE 0.9 % IV SOLN
Freq: Once | INTRAVENOUS | Status: AC
  Administered 2017-10-03: 15:00:00 via INTRAVENOUS
  Filled 2017-10-03: qty 250

## 2017-10-03 MED ORDER — SODIUM CHLORIDE 0.9 % IV SOLN
510.0000 mg | Freq: Once | INTRAVENOUS | Status: AC
  Administered 2017-10-03: 510 mg via INTRAVENOUS
  Filled 2017-10-03: qty 17

## 2017-10-03 NOTE — Patient Instructions (Signed)

## 2017-10-24 ENCOUNTER — Other Ambulatory Visit: Payer: Self-pay

## 2017-10-24 ENCOUNTER — Inpatient Hospital Stay: Payer: BLUE CROSS/BLUE SHIELD | Attending: Hematology

## 2017-10-24 DIAGNOSIS — D508 Other iron deficiency anemias: Secondary | ICD-10-CM | POA: Diagnosis present

## 2017-10-24 DIAGNOSIS — D500 Iron deficiency anemia secondary to blood loss (chronic): Secondary | ICD-10-CM

## 2017-10-24 DIAGNOSIS — D259 Leiomyoma of uterus, unspecified: Secondary | ICD-10-CM | POA: Insufficient documentation

## 2017-10-24 DIAGNOSIS — N920 Excessive and frequent menstruation with regular cycle: Secondary | ICD-10-CM | POA: Diagnosis not present

## 2017-10-24 LAB — CBC WITH DIFFERENTIAL (CANCER CENTER ONLY)
BASOS PCT: 3 %
Basophils Absolute: 0.1 10*3/uL (ref 0.0–0.1)
EOS ABS: 0 10*3/uL (ref 0.0–0.5)
Eosinophils Relative: 1 %
HCT: 37.8 % (ref 34.8–46.6)
Hemoglobin: 11.7 g/dL (ref 11.6–15.9)
Lymphocytes Relative: 45 %
Lymphs Abs: 1.3 10*3/uL (ref 0.9–3.3)
MCH: 27.5 pg (ref 25.1–34.0)
MCHC: 31 g/dL — AB (ref 31.5–36.0)
MCV: 88.7 fL (ref 79.5–101.0)
MONO ABS: 0.2 10*3/uL (ref 0.1–0.9)
MONOS PCT: 8 %
NEUTROS PCT: 43 %
Neutro Abs: 1.2 10*3/uL — ABNORMAL LOW (ref 1.5–6.5)
PLATELETS: 247 10*3/uL (ref 145–400)
RBC: 4.26 MIL/uL (ref 3.70–5.45)
RDW: 21.7 % — AB (ref 11.2–14.5)
WBC Count: 2.8 10*3/uL — ABNORMAL LOW (ref 3.9–10.3)

## 2017-10-24 LAB — IRON AND TIBC
IRON: 31 ug/dL — AB (ref 41–142)
Saturation Ratios: 15 % — ABNORMAL LOW (ref 21–57)
TIBC: 201 ug/dL — AB (ref 236–444)
UIBC: 171 ug/dL

## 2017-10-24 LAB — FERRITIN: Ferritin: 85 ng/mL (ref 11–307)

## 2017-11-03 ENCOUNTER — Telehealth: Payer: Self-pay

## 2017-11-03 NOTE — Telephone Encounter (Signed)
-----   Message from Alla Feeling, NP sent at 11/01/2017  3:36 PM EDT ----- Please let her know iron studies improved and her anemia resolved after IV iron, have her continue oral iron TID. Her white count is slightly low. Review infection precautions. Keep labs appt for 10/3. Thanks, Regan Rakers NP

## 2017-11-03 NOTE — Telephone Encounter (Signed)
Left voice message for patient per Sierra Rue NP iron studies and anemia resolved after IV iron, continue taking oral iron three times a day.  Your white count is slightly low, be mindful of signs of infection, fever, chills, body aches.

## 2017-11-23 ENCOUNTER — Inpatient Hospital Stay: Payer: BLUE CROSS/BLUE SHIELD | Attending: Hematology

## 2017-11-23 DIAGNOSIS — D508 Other iron deficiency anemias: Secondary | ICD-10-CM | POA: Diagnosis present

## 2017-11-23 DIAGNOSIS — D500 Iron deficiency anemia secondary to blood loss (chronic): Secondary | ICD-10-CM

## 2017-11-23 LAB — CBC WITH DIFFERENTIAL (CANCER CENTER ONLY)
Basophils Absolute: 0.1 10*3/uL (ref 0.0–0.1)
Basophils Relative: 3 %
Eosinophils Absolute: 0.1 10*3/uL (ref 0.0–0.5)
Eosinophils Relative: 2 %
HCT: 30.8 % — ABNORMAL LOW (ref 34.8–46.6)
HEMOGLOBIN: 9.8 g/dL — AB (ref 11.6–15.9)
LYMPHS ABS: 1 10*3/uL (ref 0.9–3.3)
LYMPHS PCT: 36 %
MCH: 28.5 pg (ref 25.1–34.0)
MCHC: 31.6 g/dL (ref 31.5–36.0)
MCV: 90.2 fL (ref 79.5–101.0)
Monocytes Absolute: 0.3 10*3/uL (ref 0.1–0.9)
Monocytes Relative: 9 %
Neutro Abs: 1.4 10*3/uL — ABNORMAL LOW (ref 1.5–6.5)
Neutrophils Relative %: 50 %
Platelet Count: 294 10*3/uL (ref 145–400)
RBC: 3.42 MIL/uL — AB (ref 3.70–5.45)
RDW: 18 % — ABNORMAL HIGH (ref 11.2–14.5)
WBC: 2.7 10*3/uL — AB (ref 3.9–10.3)

## 2017-11-23 LAB — IRON AND TIBC
IRON: 18 ug/dL — AB (ref 41–142)
Saturation Ratios: 7 % — ABNORMAL LOW (ref 21–57)
TIBC: 241 ug/dL (ref 236–444)
UIBC: 224 ug/dL

## 2017-11-23 LAB — FERRITIN: Ferritin: 8 ng/mL — ABNORMAL LOW (ref 11–307)

## 2017-11-24 ENCOUNTER — Telehealth: Payer: Self-pay | Admitting: Hematology

## 2017-11-24 ENCOUNTER — Telehealth: Payer: Self-pay

## 2017-11-24 NOTE — Telephone Encounter (Signed)
-----   Message from Alla Feeling, NP sent at 11/24/2017  9:04 AM EDT ----- Please let her know her Hgb dropped and her iron studies are low. Please arrange IV feraheme weekly x2 if she agrees. Her WBC is also mildly low, see if she has had any infection lately. Please encourage her to keep lab apt and f/u with Dr. Burr Medico next month.  Thanks, Regan Rakers NP

## 2017-11-24 NOTE — Telephone Encounter (Signed)
Unable to reach patient - left message per 10/4 sch message for patient to call back to set up iv iron

## 2017-11-24 NOTE — Telephone Encounter (Signed)
Left voice message for patient that Hemoglobin dropped a little and iron is low.  Per Sierra Rue NP recommended IV iron (feraheme) weekly x 2 weeks if she is in agreement.  Will send scheduling message to arrange.   Instructed her to call back if she does not want.

## 2017-11-24 NOTE — Telephone Encounter (Signed)
Called patient a second time - unable to reach - left message for patient to call back to set up appt.  Per 10/4 sch message.

## 2017-11-29 ENCOUNTER — Other Ambulatory Visit: Payer: Self-pay

## 2017-11-29 ENCOUNTER — Emergency Department (HOSPITAL_COMMUNITY)
Admission: EM | Admit: 2017-11-29 | Discharge: 2017-11-29 | Disposition: A | Discharge status: Home / Self Care | Payer: BLUE CROSS/BLUE SHIELD | Attending: Emergency Medicine | Admitting: Emergency Medicine

## 2017-11-29 ENCOUNTER — Emergency Department (HOSPITAL_COMMUNITY): Payer: BLUE CROSS/BLUE SHIELD

## 2017-11-29 ENCOUNTER — Encounter: Payer: Self-pay | Admitting: Emergency Medicine

## 2017-11-29 DIAGNOSIS — D219 Benign neoplasm of connective and other soft tissue, unspecified: Secondary | ICD-10-CM | POA: Diagnosis not present

## 2017-11-29 DIAGNOSIS — N938 Other specified abnormal uterine and vaginal bleeding: Secondary | ICD-10-CM | POA: Diagnosis not present

## 2017-11-29 DIAGNOSIS — Z79899 Other long term (current) drug therapy: Secondary | ICD-10-CM | POA: Diagnosis not present

## 2017-11-29 DIAGNOSIS — R1030 Lower abdominal pain, unspecified: Secondary | ICD-10-CM | POA: Diagnosis present

## 2017-11-29 LAB — WET PREP, GENITAL
SPERM: NONE SEEN
Trich, Wet Prep: NONE SEEN
YEAST WET PREP: NONE SEEN

## 2017-11-29 LAB — URINALYSIS, MICROSCOPIC (REFLEX)

## 2017-11-29 LAB — COMPREHENSIVE METABOLIC PANEL
ALT: 16 U/L (ref 0–44)
AST: 20 U/L (ref 15–41)
Albumin: 3.5 g/dL (ref 3.5–5.0)
Alkaline Phosphatase: 56 U/L (ref 38–126)
Anion gap: 8 (ref 5–15)
BUN: 12 mg/dL (ref 6–20)
CHLORIDE: 111 mmol/L (ref 98–111)
CO2: 25 mmol/L (ref 22–32)
CREATININE: 0.67 mg/dL (ref 0.44–1.00)
Calcium: 8.8 mg/dL — ABNORMAL LOW (ref 8.9–10.3)
GFR calc Af Amer: >60 mL/min (ref >60)
GLUCOSE: 79 mg/dL (ref 70–99)
POTASSIUM: 3.9 mmol/L (ref 3.5–5.1)
SODIUM: 144 mmol/L (ref 135–145)
Total Bilirubin: 0.7 mg/dL (ref 0.3–1.2)
Total Protein: 6.3 g/dL — ABNORMAL LOW (ref 6.5–8.1)

## 2017-11-29 LAB — CBC
HEMATOCRIT: 27.3 % — AB (ref 36.0–46.0)
HEMOGLOBIN: 8.2 g/dL — AB (ref 12.0–15.0)
MCH: 28.7 pg (ref 26.0–34.0)
MCHC: 30 g/dL (ref 30.0–36.0)
MCV: 95.5 fL (ref 80.0–100.0)
Platelets: 338 10*3/uL (ref 150–400)
RBC: 2.86 MIL/uL — ABNORMAL LOW (ref 3.87–5.11)
RDW: 17.8 % — AB (ref 11.5–15.5)
WBC: 4 10*3/uL (ref 4.0–10.5)
nRBC: 0 % (ref 0.0–0.2)

## 2017-11-29 LAB — I-STAT BETA HCG BLOOD, ED (MC, WL, AP ONLY)

## 2017-11-29 LAB — URINALYSIS, ROUTINE W REFLEX MICROSCOPIC

## 2017-11-29 NOTE — ED Provider Notes (Signed)
[facility] DEPT Provider Note: Georgena Spurling, MD, FACEP  CSN: [id number] MRN: [id number] ARRIVAL: 11/29/17 at [place]: [place]  Abdominal Pain   HISTORY OF PRESENT ILLNESS  11/29/17 4:40 AM Varney Biles Sierra Mejia is a 37 y.o. female with a history of fibroids and anemia is here with a 2-day history of lower abdominal pain and passing clots.  Her menses were expected to end 2 days ago but the bleeding has persisted and now consists of clots and bleeding heavier than her usual menses.  The pain is dull and not cramping in nature.  It is moderate in severity and worse with movement.  She denies any vaginal bleeding prior to her period.  She denies dysuria.  She denies fever or chills.  She denies nausea, vomiting or diarrhea.   Past Medical History:  Diagnosis Date  . Anemia   . Wears contact lenses     Past Surgical History:  Procedure Laterality Date  . MYOMECTOMY    . ORIF ANKLE FRACTURE Left 02/25/2015   Procedure: OPEN REDUCTION INTERNAL FIXATION (ORIF) ANKLE FRACTURE LEFT FIBULAR;  Surgeon: Samara Deist, DPM;  Location: [facility];  Service: Podiatry;  Laterality: Left;  WITH POPLITEAL    Family History  Problem Relation Age of Onset  . Hypertension Father   . Cancer Neg Hx     Social History   Tobacco Use  . Smoking status: Never Smoker  . Smokeless tobacco: Never Used  Substance Use Topics  . Alcohol use: Yes    Comment: social   . Drug use: Not Currently    Prior to Admission medications   Medication Sig Start Date End Date Taking? Authorizing Provider  ferrous sulfate 325 (65 FE) MG tablet Take 1 tablet (325 mg total) by mouth 3 (three) times daily with meals. Patient taking differently: Take 325 mg by mouth 2 (two) times daily with a meal.  08/15/16  Yes Anyanwu, Sallyanne Havers, MD    Allergies Patient has no known allergies.   REVIEW OF SYSTEMS  Negative except as noted here or in the History of Present Illness.   PHYSICAL  EXAMINATION  Initial Vital Signs Blood pressure (!) 161/108, pulse 65, temperature 98.9 F (37.2 C), temperature source Oral, resp. rate 18, last menstrual period 11/20/2017, SpO2 100 %.  Examination General: Well-developed, well-nourished female in no acute distress; appearance consistent with age of record HENT: normocephalic; atraumatic Eyes: pupils equal, round and reactive to light; extraocular muscles intact Neck: supple Heart: regular rate and rhythm Lungs: clear to auscultation bilaterally Abdomen: soft; nondistended; suprapubic tenderness; suprapubic mass; bowel sounds present GU: Normal external genitalia; heavy vaginal bleeding; mild cervical motion tenderness; enlarged, firm, irregular uterus Extremities: No deformity; full range of motion; pulses normal Neurologic: Awake, alert and oriented; motor function intact in all extremities and symmetric; no facial droop Skin: Warm and dry Psychiatric: Normal mood and affect   RESULTS  Summary of this visit's results, reviewed by myself:   EKG Interpretation  Date/Time:    Ventricular Rate:    PR Interval:    QRS Duration:   QT Interval:    QTC Calculation:   R Axis:     Text Interpretation:        Laboratory Studies: Results for orders placed or performed during the hospital encounter of 11/29/17 (from the past 24 hour(s))  Comprehensive metabolic panel     Status: Abnormal   Collection Time: 11/29/17  3:52 AM  Result Value Ref Range  Sodium 144 135 - 145 mmol/L   Potassium 3.9 3.5 - 5.1 mmol/L   Chloride 111 98 - 111 mmol/L   CO2 25 22 - 32 mmol/L   Glucose, Bld 79 70 - 99 mg/dL   BUN 12 6 - 20 mg/dL   Creatinine, Ser 0.67 0.44 - 1.00 mg/dL   Calcium 8.8 (L) 8.9 - 10.3 mg/dL   Total Protein 6.3 (L) 6.5 - 8.1 g/dL   Albumin 3.5 3.5 - 5.0 g/dL   AST 20 15 - 41 U/L   ALT 16 0 - 44 U/L   Alkaline Phosphatase 56 38 - 126 U/L   Total Bilirubin 0.7 0.3 - 1.2 mg/dL   GFR calc non Af Amer >60 >60 mL/min   GFR  calc Af Amer >60 >60 mL/min   Anion gap 8 5 - 15  CBC     Status: Abnormal   Collection Time: 11/29/17  3:52 AM  Result Value Ref Range   WBC 4.0 4.0 - 10.5 K/uL   RBC 2.86 (L) 3.87 - 5.11 MIL/uL   Hemoglobin 8.2 (L) 12.0 - 15.0 g/dL   HCT 27.3 (L) 36.0 - 46.0 %   MCV 95.5 80.0 - 100.0 fL   MCH 28.7 26.0 - 34.0 pg   MCHC 30.0 30.0 - 36.0 g/dL   RDW 17.8 (H) 11.5 - 15.5 %   Platelets 338 150 - 400 K/uL   nRBC 0.0 0.0 - 0.2 %  Urinalysis, Routine w reflex microscopic     Status: Abnormal   Collection Time: 11/29/17  3:52 AM  Result Value Ref Range   Color, Urine RED (A) YELLOW   APPearance TURBID (A) CLEAR   Specific Gravity, Urine  1.005 - 1.030    TEST NOT REPORTED DUE TO COLOR INTERFERENCE OF URINE PIGMENT   pH  5.0 - 8.0    TEST NOT REPORTED DUE TO COLOR INTERFERENCE OF URINE PIGMENT   Glucose, UA (A) NEGATIVE mg/dL    TEST NOT REPORTED DUE TO COLOR INTERFERENCE OF URINE PIGMENT   Hgb urine dipstick (A) NEGATIVE    TEST NOT REPORTED DUE TO COLOR INTERFERENCE OF URINE PIGMENT   Bilirubin Urine (A) NEGATIVE    TEST NOT REPORTED DUE TO COLOR INTERFERENCE OF URINE PIGMENT   Ketones, ur (A) NEGATIVE mg/dL    TEST NOT REPORTED DUE TO COLOR INTERFERENCE OF URINE PIGMENT   Protein, ur (A) NEGATIVE mg/dL    TEST NOT REPORTED DUE TO COLOR INTERFERENCE OF URINE PIGMENT   Nitrite (A) NEGATIVE    TEST NOT REPORTED DUE TO COLOR INTERFERENCE OF URINE PIGMENT   Leukocytes, UA (A) NEGATIVE    TEST NOT REPORTED DUE TO COLOR INTERFERENCE OF URINE PIGMENT  Urinalysis, Microscopic (reflex)     Status: Abnormal   Collection Time: 11/29/17  3:52 AM  Result Value Ref Range   RBC / HPF >50 0 - 5 RBC/hpf   WBC, UA 21-50 0 - 5 WBC/hpf   Bacteria, UA RARE (A) NONE SEEN   Squamous Epithelial / LPF 0-5 0 - 5  I-Stat beta hCG blood, ED     Status: None   Collection Time: 11/29/17  3:58 AM  Result Value Ref Range   I-stat hCG, quantitative <5.0 <5 mIU/mL   Comment 3          Wet prep, genital      Status: Abnormal   Collection Time: 11/29/17  5:53 AM  Result Value Ref Range   Yeast Wet Prep  HPF POC NONE SEEN NONE SEEN   Trich, Wet Prep NONE SEEN NONE SEEN   Clue Cells Wet Prep HPF POC PRESENT (A) NONE SEEN   WBC, Wet Prep HPF POC FEW (A) NONE SEEN   Sperm NONE SEEN    Imaging Studies: US Pelvis Complete  Result Date: 11/29/2017 CLINICAL DATA:  Pain.  History of fibroids. EXAM: TRANSABDOMINAL ULTRASOUND OF PELVIS TECHNIQUE: Transabdominal ultrasound examination of the pelvis was performed including evaluation of the uterus, ovaries, adnexal regions, and pelvic cul-de-sac. Patient refused transvaginal examination. COMPARISON:  None. FINDINGS: Uterus Measurements: 14.1 x 8 cm. Limited visualization due to body habitus. Multiple nodules demonstrated throughout the uterus consistent with uterine fibroids. At least 4 separate nodules are identified, measuring up to about 6.3 cm maximal diameter. Endometrium Thickness: 3 mm.  No focal abnormality visualized. Right ovary Measurements: 3.5 x 2.7 x 3 cm.  Normal appearance/no adnexal mass. Left ovary Measurements: 3.8 x 2.5 x 2.5 cm. Normal appearance/no adnexal mass. Other findings:  No abnormal free fluid. IMPRESSION: 1. Multiple uterine fibroids. 2. Endometrium and ovaries are unremarkable as visualized. Electronically Signed   By: Lucienne Capers M.D.   On: 11/29/2017 06:56    ED COURSE and MDM  Nursing notes and initial vitals signs, including pulse oximetry, reviewed.  Vitals:   11/29/17 0047 11/29/17 0334 11/29/17 0657  BP: (!) 159/108 (!) 161/108 (!) 141/88  Pulse: 95 65 77  Resp: 20 18 18   Temp: 98.9 F (37.2 C)    TempSrc: Oral    SpO2: 100% 100% 100%   7:06 AM Patient already aware of anemia and she is getting regular infusions of iron at the cancer center.  Her next infusion is Friday.  She has an OB/GYN, Dr. Leo Grosser.  She was advised to follow-up with Dr. Leo Grosser regarding her multiple fibroids and the possibility of a  therapeutic hysterectomy.  She declines analgesics at this time.  PROCEDURES    ED DIAGNOSES     ICD-10-CM   1. Fibroids D21.9   2. Dysfunctional uterine bleeding N93.8        Tersea Aulds, Jenny Reichmann, MD 11/29/17 0710

## 2017-11-29 NOTE — ED Notes (Signed)
US at bedside

## 2017-11-29 NOTE — ED Triage Notes (Signed)
Pt complains of abdominal and passing clots for two days, she says that he menstrual period was over two days ago Pt denies and vomiting or diarrhea

## 2017-11-30 ENCOUNTER — Other Ambulatory Visit: Payer: Self-pay | Admitting: Obstetrics & Gynecology

## 2017-11-30 LAB — GC/CHLAMYDIA PROBE AMP (~~LOC~~) NOT AT ARMC
CHLAMYDIA, DNA PROBE: NEGATIVE
Neisseria Gonorrhea: NEGATIVE

## 2017-12-01 ENCOUNTER — Other Ambulatory Visit: Payer: Self-pay | Admitting: Hematology

## 2017-12-01 ENCOUNTER — Inpatient Hospital Stay: Payer: BLUE CROSS/BLUE SHIELD

## 2017-12-01 VITALS — BP 119/69 | HR 79 | Temp 98.1°F | Resp 16

## 2017-12-01 DIAGNOSIS — D508 Other iron deficiency anemias: Secondary | ICD-10-CM | POA: Diagnosis not present

## 2017-12-01 DIAGNOSIS — D500 Iron deficiency anemia secondary to blood loss (chronic): Secondary | ICD-10-CM

## 2017-12-01 MED ORDER — SODIUM CHLORIDE 0.9 % IV SOLN
510.0000 mg | Freq: Once | INTRAVENOUS | Status: AC
  Administered 2017-12-01: 510 mg via INTRAVENOUS
  Filled 2017-12-01: qty 17

## 2017-12-01 MED ORDER — SODIUM CHLORIDE 0.9 % IV SOLN
Freq: Once | INTRAVENOUS | Status: AC
  Administered 2017-12-01: 15:00:00 via INTRAVENOUS
  Filled 2017-12-01: qty 250

## 2017-12-01 NOTE — Progress Notes (Signed)
Pt refused to stay full obs period post-feraheme; VSS (see flowsheet).

## 2017-12-01 NOTE — Patient Instructions (Signed)

## 2017-12-04 ENCOUNTER — Other Ambulatory Visit: Payer: Self-pay | Admitting: Obstetrics and Gynecology

## 2017-12-04 DIAGNOSIS — D259 Leiomyoma of uterus, unspecified: Secondary | ICD-10-CM

## 2017-12-06 ENCOUNTER — Telehealth: Payer: Self-pay | Admitting: Hematology

## 2017-12-06 DIAGNOSIS — R87612 Low grade squamous intraepithelial lesion on cytologic smear of cervix (LGSIL): Secondary | ICD-10-CM | POA: Insufficient documentation

## 2017-12-06 NOTE — Telephone Encounter (Signed)
Appts r/s and patient notified LMVM with new date/time per 10/16 sch msg

## 2017-12-08 ENCOUNTER — Ambulatory Visit: Payer: Self-pay

## 2017-12-11 ENCOUNTER — Other Ambulatory Visit: Payer: Self-pay | Admitting: Obstetrics and Gynecology

## 2017-12-11 ENCOUNTER — Inpatient Hospital Stay: Payer: BLUE CROSS/BLUE SHIELD

## 2017-12-11 VITALS — BP 126/83 | HR 71 | Temp 98.7°F | Resp 18

## 2017-12-11 DIAGNOSIS — D250 Submucous leiomyoma of uterus: Secondary | ICD-10-CM

## 2017-12-11 DIAGNOSIS — D500 Iron deficiency anemia secondary to blood loss (chronic): Secondary | ICD-10-CM

## 2017-12-11 DIAGNOSIS — D508 Other iron deficiency anemias: Secondary | ICD-10-CM | POA: Diagnosis not present

## 2017-12-11 MED ORDER — SODIUM CHLORIDE 0.9 % IV SOLN
Freq: Once | INTRAVENOUS | Status: AC
  Administered 2017-12-11: 10:00:00 via INTRAVENOUS
  Filled 2017-12-11: qty 250

## 2017-12-11 MED ORDER — SODIUM CHLORIDE 0.9 % IV SOLN
510.0000 mg | Freq: Once | INTRAVENOUS | Status: AC
  Administered 2017-12-11: 510 mg via INTRAVENOUS
  Filled 2017-12-11: qty 17

## 2017-12-11 NOTE — Patient Instructions (Signed)

## 2017-12-14 ENCOUNTER — Ambulatory Visit
Admission: RE | Admit: 2017-12-14 | Discharge: 2017-12-14 | Disposition: A | Discharge status: Home / Self Care | Payer: BLUE CROSS/BLUE SHIELD | Source: Ambulatory Visit | Attending: Obstetrics and Gynecology | Admitting: Obstetrics and Gynecology

## 2017-12-14 DIAGNOSIS — D250 Submucous leiomyoma of uterus: Secondary | ICD-10-CM

## 2017-12-14 HISTORY — DX: Benign neoplasm of connective and other soft tissue, unspecified: D21.9

## 2017-12-14 HISTORY — PX: IR RADIOLOGIST EVAL & MGMT: IMG5224

## 2017-12-14 NOTE — Progress Notes (Signed)
Chief Complaint: Patient was seen in consultation today for  Chief Complaint  Patient presents with  . Advice Only    Consult for [place]     at the request of [place]  Referring Physician(s): Haygood,Vanessa P  History of Present Illness: Sierra Mejia is a 37 y.o. female with history of uterine fibroids and menorrhagia.  Patient underwent a myomectomy in 2014 in [place], [place].  The myomectomy was performed for menorrhagia.  Menstrual bleeding decreased for approximately a year after the myomectomy but feels like the bleeding is worse than its ever been.  Patient reports 5 days of menstrual bleeding with 2 days of severe bleeding with clots.  The 2 days of heavy bleeding is so severe that she has to change adult diapers or pads every 2 hours.  In addition, the patient is anemic and has received iron transfusions on 12/01/2017 and 12/11/2017.  The bleeding has been worse in the last month and she started taking medroxyprogesterone on 12/04/2017.  Today was one of the first days this month that she has not noted vaginal spotting.  Patient complains of weakness but she says that it is been chronic and she is used to it.  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  Patient has never been pregnant.  Patient is still interested in preserving her fertility but has no plans for pregnancy in the near future and no current sexual partner.  Patient says she had an endometrial biopsy last month but I do not have those results.  No other significant medical problems except for old fracture and surgery to the left ankle with bilateral ankle swelling.  Past Medical History:  Diagnosis Date  . Anemia   . Fibroids   . Wears contact lenses     Past Surgical History:  Procedure Laterality Date  . IR RADIOLOGIST EVAL & MGMT  12/14/2017  . MYOMECTOMY    . ORIF ANKLE FRACTURE Left 02/25/2015   Procedure: OPEN REDUCTION INTERNAL FIXATION (ORIF) ANKLE FRACTURE LEFT FIBULAR;   Surgeon: Samara Deist, DPM;  Location: [facility];  Service: Podiatry;  Laterality: Left;  WITH POPLITEAL    Allergies: Patient has no known allergies.  Medications: Prior to Admission medications   Medication Sig Start Date End Date Taking? Authorizing Provider  Ferrous Sulfate (IRON) 325 (65 Fe) MG TABS TAKE 1 TABLET BY MOUTH THREE TIMES DAILY WITH MEALS 11/30/17  Yes Anyanwu, Ugonna A, MD  ibuprofen (ADVIL,MOTRIN) 200 MG tablet Take 200 mg by mouth every 6 (six) hours as needed.   Yes [provider]  medroxyPROGESTERone (PROVERA) 10 MG tablet Take 10 mg by mouth daily.   Yes [provider]     Family History  Problem Relation Age of Onset  . Hypertension Father   . Cancer Neg Hx     Social History   Socioeconomic History  . Marital status: Single    Spouse name: Not on file  . Number of children: 0  . Years of education: Not on file  . Highest education level: Not on file  Occupational History  . Not on file  Social Needs  . Financial resource strain: Not on file  . Food insecurity:    Worry: Not on file    Inability: Not on file  . Transportation needs:    Medical: Not on file    Non-medical: Not on file  Tobacco Use  . Smoking status: Never Smoker  . Smokeless tobacco:  Never Used  Substance and Sexual Activity  . Alcohol use: Yes    Comment: social   . Drug use: Not Currently  . Sexual activity: Not on file  Lifestyle  . Physical activity:    Days per week: Not on file    Minutes per session: Not on file  . Stress: Not on file  Relationships  . Social connections:    Talks on phone: Not on file    Gets together: Not on file    Attends religious service: Not on file    Active member of club or organization: Not on file    Attends meetings of clubs or organizations: Not on file    Relationship status: Not on file  Other Topics Concern  . Not on file  Social History Narrative  . Not on file     Review of Systems    Constitutional: Positive for fatigue.  Cardiovascular: Positive for leg swelling.  Gastrointestinal: Positive for abdominal pain.  Genitourinary: Positive for menstrual problem, pelvic pain and vaginal bleeding.    Vital Signs: BP 134/79   Pulse 81   Temp 99.2 F (37.3 C) (Oral)   Resp 15   Ht 5\' 9"  (1.753 m)   Wt 125.2 kg   LMP 11/20/2017 (Approximate)   SpO2 100%   BMI 40.76 kg/m   Physical Exam  Constitutional: No distress.  Cardiovascular: Normal rate, regular rhythm and normal heart sounds.  Pulmonary/Chest: Effort normal and breath sounds normal.  Abdominal: Soft. Bowel sounds are normal. She exhibits no distension. There is no tenderness.  Musculoskeletal: She exhibits edema.  Bilateral ankle swelling, left side greater than right.  Pitting edema in the left ankle is 3+ and right ankle pitting edema is 2+.  Old surgical scar along the lateral left ankle.  Faint bilateral pedal pulses.        Imaging: US Pelvis Complete  Result Date: 11/29/2017 CLINICAL DATA:  Pain.  History of fibroids. EXAM: TRANSABDOMINAL ULTRASOUND OF PELVIS TECHNIQUE: Transabdominal ultrasound examination of the pelvis was performed including evaluation of the uterus, ovaries, adnexal regions, and pelvic cul-de-sac. Patient refused transvaginal examination. COMPARISON:  None. FINDINGS: Uterus Measurements: 14.1 x 8 cm. Limited visualization due to body habitus. Multiple nodules demonstrated throughout the uterus consistent with uterine fibroids. At least 4 separate nodules are identified, measuring up to about 6.3 cm maximal diameter. Endometrium Thickness: 3 mm.  No focal abnormality visualized. Right ovary Measurements: 3.5 x 2.7 x 3 cm.  Normal appearance/no adnexal mass. Left ovary Measurements: 3.8 x 2.5 x 2.5 cm. Normal appearance/no adnexal mass. Other findings:  No abnormal free fluid. IMPRESSION: 1. Multiple uterine fibroids. 2. Endometrium and ovaries are unremarkable as visualized.  Electronically Signed   By: Lucienne Capers M.D.   On: 11/29/2017 06:56   Ir Radiologist Eval & Mgmt  Result Date: 12/14/2017 Please refer to notes tab for details about interventional procedure. (Op Note)   Labs:  CBC: Recent Labs    09/22/17 1452 10/24/17 1139 11/23/17 1134 11/29/17 0352  WBC 3.3* 2.8* 2.7* 4.0  HGB 10.0* 11.7 9.8* 8.2*  HCT 33.8* 37.8 30.8* 27.3*  PLT 381 247 294 338    COAGS: No results for input(s): INR, APTT in the last 8760 hours.  BMP: Recent Labs    11/29/17 0352  NA 144  K 3.9  CL 111  CO2 25  GLUCOSE 79  BUN 12  CALCIUM 8.8*  CREATININE 0.67  GFRNONAA >60  GFRAA >60  LIVER FUNCTION TESTS: Recent Labs    11/29/17 0352  BILITOT 0.7  AST 20  ALT 16  ALKPHOS 56  PROT 6.3*  ALBUMIN 3.5    TUMOR MARKERS: No results for input(s): AFPTM, CEA, CA199, CHROMGRNA in the last 8760 hours.  Assessment and Plan:  37 year old with uterine fibroids and severe menorrhagia causing anemia and requiring iron transfusions.  History of myomectomy in 2014 which gave the patient temporary relief from the menorrhagia.  At this point, the patient feels that the menstrual bleeding is the worst than ever and seeking additional treatment.  Patient is scheduled to have an MRI of the pelvis in a few days.  We had a long discussion about the treatment options for uterine fibroids including myomectomy, hysterectomy and uterine artery embolization.  Patient wants to preserve her fertility but she understands that she would be a high risk pregnancy based on her age and uterine fibroids.  Specifically, I explained that pregnancy and uterine artery embolization is controversial.  Although fertility is possible after uterine artery embolization, there could be increased risks following the uterine artery embolization procedure and this would not be my treatment of choice if she is highly motivated to get pregnant.  We discussed the uterine artery embolization  procedure details, risks and recovery.  Patient has a good understanding that she will need to be out of work for 1 to 2 weeks.  After a long discussion of the uterine artery embolization procedure, patient says that she would like to pursue the embolization treatment if she is a candidate.  We will need to review the pelvic MRI which is scheduled for a few days.  Patient would be a candidate for uterine artery embolization if there are no contraindications based on the MRI.  We will contact patient after I have reviewed the MRI.  Bilateral ankle swelling: Left ankle swelling is worse than the right and probably related to previous trauma and surgery.  I prescribed the patient knee-high compression stockings measuring 15 to 20 mmHg.  Patient may benefit from venous duplex of the lower extremities to evaluate for venous insufficiency.  Thank you for this interesting consult.  I greatly enjoyed meeting PAULINA MUCHMORE and look forward to participating in their care.  A copy of this report was sent to the requesting provider on this date.  Electronically Signed: Burman Riis 12/14/2017, 3:46 PM    I spent a total of  40 Minutes   in face to face in clinical consultation, greater than 50% of which was counseling/coordinating care for uterine fibroids and menorrhagia.  Patient ID: Sierra Mejia, female   DOB: Dec 23, 1980, 37 y.o.   MRN: [id number]

## 2017-12-16 ENCOUNTER — Ambulatory Visit
Admission: RE | Admit: 2017-12-16 | Discharge: 2017-12-16 | Disposition: A | Discharge status: Home / Self Care | Payer: BLUE CROSS/BLUE SHIELD | Source: Ambulatory Visit | Attending: Obstetrics and Gynecology | Admitting: Obstetrics and Gynecology

## 2017-12-16 ENCOUNTER — Other Ambulatory Visit: Payer: Self-pay

## 2017-12-16 DIAGNOSIS — D259 Leiomyoma of uterus, unspecified: Secondary | ICD-10-CM

## 2017-12-16 MED ORDER — GADOBENATE DIMEGLUMINE 529 MG/ML IV SOLN
20.0000 mL | Freq: Once | INTRAVENOUS | Status: AC | PRN
  Administered 2017-12-16: 20 mL via INTRAVENOUS

## 2017-12-19 ENCOUNTER — Telehealth: Payer: Self-pay | Admitting: Hematology

## 2017-12-19 NOTE — Telephone Encounter (Signed)
YF PAL 11/4 - moved appointments to 11/5. Left message for patient. Schedule mailed.

## 2017-12-25 ENCOUNTER — Ambulatory Visit: Payer: Self-pay | Admitting: Hematology

## 2017-12-25 ENCOUNTER — Other Ambulatory Visit: Payer: Self-pay | Admitting: Obstetrics and Gynecology

## 2017-12-25 ENCOUNTER — Other Ambulatory Visit: Payer: Self-pay

## 2017-12-26 ENCOUNTER — Encounter: Payer: Self-pay | Admitting: Hematology

## 2017-12-26 ENCOUNTER — Inpatient Hospital Stay: Payer: BLUE CROSS/BLUE SHIELD | Attending: Hematology

## 2017-12-26 ENCOUNTER — Inpatient Hospital Stay (HOSPITAL_BASED_OUTPATIENT_CLINIC_OR_DEPARTMENT_OTHER): Payer: BLUE CROSS/BLUE SHIELD | Admitting: Hematology

## 2017-12-26 ENCOUNTER — Telehealth: Payer: Self-pay | Admitting: Hematology

## 2017-12-26 VITALS — BP 132/73 | HR 86 | Temp 99.2°F | Resp 18 | Ht 69.0 in | Wt 269.5 lb

## 2017-12-26 DIAGNOSIS — Z793 Long term (current) use of hormonal contraceptives: Secondary | ICD-10-CM | POA: Insufficient documentation

## 2017-12-26 DIAGNOSIS — Z791 Long term (current) use of non-steroidal anti-inflammatories (NSAID): Secondary | ICD-10-CM | POA: Diagnosis not present

## 2017-12-26 DIAGNOSIS — D500 Iron deficiency anemia secondary to blood loss (chronic): Secondary | ICD-10-CM | POA: Diagnosis present

## 2017-12-26 DIAGNOSIS — D259 Leiomyoma of uterus, unspecified: Secondary | ICD-10-CM | POA: Diagnosis not present

## 2017-12-26 DIAGNOSIS — N920 Excessive and frequent menstruation with regular cycle: Secondary | ICD-10-CM | POA: Diagnosis not present

## 2017-12-26 LAB — FERRITIN: FERRITIN: 80 ng/mL (ref 11–307)

## 2017-12-26 LAB — CBC WITH DIFFERENTIAL (CANCER CENTER ONLY)
ABS IMMATURE GRANULOCYTES: 0.01 10*3/uL (ref 0.00–0.07)
BASOS ABS: 0 10*3/uL (ref 0.0–0.1)
Basophils Relative: 1 %
EOS ABS: 0 10*3/uL (ref 0.0–0.5)
Eosinophils Relative: 1 %
HEMATOCRIT: 31.1 % — AB (ref 36.0–46.0)
HEMOGLOBIN: 9.5 g/dL — AB (ref 12.0–15.0)
IMMATURE GRANULOCYTES: 0 %
LYMPHS ABS: 0.8 10*3/uL (ref 0.7–4.0)
Lymphocytes Relative: 25 %
MCH: 30.5 pg (ref 26.0–34.0)
MCHC: 30.5 g/dL (ref 30.0–36.0)
MCV: 100 fL (ref 80.0–100.0)
MONOS PCT: 9 %
Monocytes Absolute: 0.3 10*3/uL (ref 0.1–1.0)
NEUTROS ABS: 2 10*3/uL (ref 1.7–7.7)
NEUTROS PCT: 64 %
Platelet Count: 410 10*3/uL — ABNORMAL HIGH (ref 150–400)
RBC: 3.11 MIL/uL — ABNORMAL LOW (ref 3.87–5.11)
RDW: 15.5 % (ref 11.5–15.5)
WBC Count: 3.1 10*3/uL — ABNORMAL LOW (ref 4.0–10.5)
nRBC: 0 % (ref 0.0–0.2)

## 2017-12-26 LAB — IRON AND TIBC
Iron: 27 ug/dL — ABNORMAL LOW (ref 41–142)
SATURATION RATIOS: 13 % — AB (ref 21–57)
TIBC: 209 ug/dL — AB (ref 236–444)
UIBC: 182 ug/dL (ref 120–384)

## 2017-12-26 NOTE — Progress Notes (Signed)
Sierra Mejia  [phone] [phone]  Clinic Follow up Note   Patient Care Team: Eldred Manges, MD as PCP - General (Obstetrics and Gynecology) 12/26/2017   CHIEF COMPLAIN: f/u anemia   HISTORY OF PRESENTING ILLNESS (09/22/2017):  Sierra Mejia 37 y.o. female is here because of low hemoglobin is medical history significant for iron deficiency anemia secondary to menorrhagia and uterine fibroids. Patient reports she has been anemic for 5 years.  Per outside records Hgb on 08/15/2016 of 7 0; on 08/28/2017 Hgb is 7.7.  No previous iron studies are available for my review.  She reportedly had one blood transfusion in 2015 for hemoglobin of 5 and has had one IV iron infusion which she tolerated well 1 year ago.  Has never donated blood.  She takes her sulfate 2 tabs per day for the last 5 years.  She tolerates oral iron without constipation or obvious side effects.  She denies anticoagulant use.  She uses NSAIDs during menstrual cycles otherwise none.  She denies other obvious sources of bleeding such as hematochezia, epistaxis, or hematuria.  She denies creased fatigue, recent chest pain on exertion, shortness of breath on minimal exertion, pre-syncopal episodes, or palpitations.  She is up-to-date on Pap smear, has not had mammogram or colonoscopy yet. She had no prior history or diagnosis of cancer or blood disorder. She denies any pica and eats a variety of diet.  She has no significant past medical history other than iron deficiency anemia and uterine fibroids.  She had myomectomy in 2014.  Menarche at age 75, LMP 08/10/2017.  Cycles last 5 days and occur monthly.  She has heavy bleeding for the first 2 days, changes hygiene product every 1.5 hours.  Has had heavy menses for 5 years.   She denies unintentional weight loss, increased fatigue, night sweats, fever.  Appetite is normal.  Denies nausea, burning, constipation, or diarrhea.  No recent fever or chills,  chest pain, dyspnea, or cough.  CURRENT THERAPY: IV Feraheme as needed   INTERVAL HISTORY: Sierra Mejia returns for follow-up.  Initially seen by Sierra Mejia in the me on September 22, 2017, she received 2 doses of IV iron right after last visit and she a lot better.  She still has very heavy menstrual.,  She actually has had vaginal bleeding for months (very heavy for a week, and spotting for the rest of month), which just topped a few days ago after she started medroxyprogesterone by her GYN. She went to emergency room for heavy bleeding on November 29, 2017, and was found to have moderate anemia with hemoglobin 8.5.  I gave her 2 more doses of IV iron on October 11 and October 21, she tolerated very well, and overall felt better.  New complaints.  REVIEW OF SYSTEMS:   Constitutional: Denies fevers, chills or abnormal weight loss, (+) fatigue  Eyes: Denies blurriness of vision Ears, nose, mouth, throat, and face: Denies mucositis or sore throat Respiratory: Denies cough, dyspnea or wheezes Cardiovascular: Denies palpitation, chest discomfort or lower extremity swelling Gastrointestinal:  Denies nausea, heartburn or change in bowel habits Skin: Denies abnormal skin rashes Lymphatics: Denies new lymphadenopathy or easy bruising Neurological:Denies numbness, tingling or new weaknesses Behavioral/Psych: Mood is stable, no new changes  All other systems were reviewed with the patient and are negative.  MEDICAL HISTORY:  Past Medical History:  Diagnosis Date  . Anemia   . Fibroids   . Wears contact lenses     SURGICAL HISTORY:  Past Surgical History:  Procedure Laterality Date  . IR RADIOLOGIST EVAL & MGMT  12/14/2017  . MYOMECTOMY    . ORIF ANKLE FRACTURE Left 02/25/2015   Procedure: OPEN REDUCTION INTERNAL FIXATION (ORIF) ANKLE FRACTURE LEFT FIBULAR;  Surgeon: Samara Deist, DPM;  Location: [facility];  Service: Podiatry;  Laterality: Left;  WITH POPLITEAL    I have reviewed the social history  and family history with the patient and they are unchanged from previous note.  ALLERGIES:  has No Known Allergies.  MEDICATIONS:  Current Outpatient Medications  Medication Sig Dispense Refill  . Ferrous Sulfate (IRON) 325 (65 Fe) MG TABS TAKE 1 TABLET BY MOUTH THREE TIMES DAILY WITH MEALS 90 tablet 3  . ibuprofen (ADVIL,MOTRIN) 200 MG tablet Take 200 mg by mouth every 6 (six) hours as needed.    . medroxyPROGESTERone (PROVERA) 10 MG tablet Take 10 mg by mouth daily.     No current facility-administered medications for this visit.     PHYSICAL EXAMINATION: ECOG PERFORMANCE STATUS: 1 - Symptomatic but completely ambulatory  There were no vitals filed for this visit. There were no vitals filed for this visit.  GENERAL:alert, no distress and comfortable SKIN: skin color, texture, turgor are normal, no rashes or significant lesions EYES: normal, Conjunctiva are pink and non-injected, sclera clear OROPHARYNX:no exudate, no erythema and lips, buccal mucosa, and tongue normal  NECK: supple, thyroid normal size, non-tender, without nodularity LYMPH:  no palpable lymphadenopathy in the cervical, axillary or inguinal LUNGS: clear to auscultation and percussion with normal breathing effort HEART: regular rate & rhythm and no murmurs and no lower extremity edema ABDOMEN:abdomen soft, non-tender and normal bowel sounds Musculoskeletal:no cyanosis of digits and no clubbing  NEURO: alert & oriented x 3 with fluent speech, no focal motor/sensory deficits  LABORATORY DATA:  I have reviewed the data as listed CBC Latest Ref Rng & Units 11/29/2017 11/23/2017 10/24/2017  WBC 4.0 - 10.5 K/uL 4.0 2.7(L) 2.8(L)  Hemoglobin 12.0 - 15.0 g/dL 8.2(L) 9.8(L) 11.7  Hematocrit 36.0 - 46.0 % 27.3(L) 30.8(L) 37.8  Platelets 150 - 400 K/uL 338 294 247     CMP Latest Ref Rng & Units 11/29/2017  Glucose 70 - 99 mg/dL 79  BUN 6 - 20 mg/dL 12  Creatinine 0.44 - 1.00 mg/dL 0.67  Sodium 135 - 145 mmol/L 144    Potassium 3.5 - 5.1 mmol/L 3.9  Chloride 98 - 111 mmol/L 111  CO2 22 - 32 mmol/L 25  Calcium 8.9 - 10.3 mg/dL 8.8(L)  Total Protein 6.5 - 8.1 g/dL 6.3(L)  Total Bilirubin 0.3 - 1.2 mg/dL 0.7  Alkaline Phos 38 - 126 U/L 56  AST 15 - 41 U/L 20  ALT 0 - 44 U/L 16      RADIOGRAPHIC STUDIES: I have personally reviewed the radiological images as listed and agreed with the findings in the report. No results found.   ASSESSMENT & PLAN: Sierra Mejia is a 37 y.o. AAF with no significant past medical history referred for worsening anemia with history of iron deficiency anemia  1. Iron deficient anemia, secondary to menorrhagia -She has chronic anemia and iron deficiency, previous required blood transfusion once and IV iron once about 1 year ago.   -She has menorrhagia from fibroid. This is likely the cause of her IDA. She has discussed with her gynecologist about treatment options. She will start lupron injection soon, while she is waiting for embolization  -No other clinical signs of bleeding. stool OB (-) -  She previously responded well to IV iron, anemia resolved -Due to her severe menorrhagia, and continues vaginal spotting for months, she has developed moderate anemia again, which improved after IV iron -Lab reviewed, hemoglobin 9.5 today, serum iron and transferrin saturation are low, her TIBC is also low, ferritin 80, she may have a component of anemia of chronic disease  -will give one dose iv feraheme next week -Continue monitoring CBC and iron level monthly -she is scheduled to get a Lupron injection, and plan to have uterus embolization for her fibroid, which will likely improve her menorrhagia -f/u in 3 months   No orders of the defined types were placed in this encounter.  All questions were answered. The patient knows to call the clinic with any problems, questions or concerns. No barriers to learning was detected. I spent 15 minutes counseling the patient face to face. The  total time spent in the appointment was 20 minutes and more than 50% was on counseling and review of test results     Truitt Merle, MD 12/26/17

## 2017-12-26 NOTE — Telephone Encounter (Signed)
Appts scheduled avs/calendar printed per 11/5 los °

## 2017-12-27 ENCOUNTER — Telehealth: Payer: Self-pay

## 2017-12-27 NOTE — Telephone Encounter (Signed)
Faxed lab results to Dr. Leo Grosser at [phone]

## 2018-01-01 ENCOUNTER — Other Ambulatory Visit (HOSPITAL_COMMUNITY): Payer: Self-pay | Admitting: Diagnostic Radiology

## 2018-01-01 DIAGNOSIS — D259 Leiomyoma of uterus, unspecified: Secondary | ICD-10-CM

## 2018-01-04 ENCOUNTER — Ambulatory Visit: Payer: Self-pay

## 2018-01-05 ENCOUNTER — Inpatient Hospital Stay: Payer: BLUE CROSS/BLUE SHIELD

## 2018-01-05 VITALS — BP 147/98 | HR 72 | Temp 98.8°F | Resp 18

## 2018-01-05 DIAGNOSIS — D500 Iron deficiency anemia secondary to blood loss (chronic): Secondary | ICD-10-CM

## 2018-01-05 MED ORDER — SODIUM CHLORIDE 0.9 % IV SOLN
510.0000 mg | Freq: Once | INTRAVENOUS | Status: AC
  Administered 2018-01-05: 510 mg via INTRAVENOUS
  Filled 2018-01-05: qty 17

## 2018-01-05 MED ORDER — SODIUM CHLORIDE 0.9 % IV SOLN
Freq: Once | INTRAVENOUS | Status: AC
  Administered 2018-01-05: 15:00:00 via INTRAVENOUS
  Filled 2018-01-05: qty 250

## 2018-01-05 NOTE — Patient Instructions (Signed)

## 2018-01-08 ENCOUNTER — Encounter: Payer: Self-pay | Admitting: *Deleted

## 2018-01-23 ENCOUNTER — Inpatient Hospital Stay: Payer: BLUE CROSS/BLUE SHIELD | Attending: Hematology

## 2018-01-23 DIAGNOSIS — D500 Iron deficiency anemia secondary to blood loss (chronic): Secondary | ICD-10-CM

## 2018-01-23 LAB — CBC WITH DIFFERENTIAL (CANCER CENTER ONLY)
ABS IMMATURE GRANULOCYTES: 0.01 10*3/uL (ref 0.00–0.07)
Basophils Absolute: 0 10*3/uL (ref 0.0–0.1)
Basophils Relative: 1 %
Eosinophils Absolute: 0 10*3/uL (ref 0.0–0.5)
Eosinophils Relative: 2 %
HEMATOCRIT: 31.5 % — AB (ref 36.0–46.0)
HEMOGLOBIN: 10 g/dL — AB (ref 12.0–15.0)
IMMATURE GRANULOCYTES: 0 %
LYMPHS ABS: 0.9 10*3/uL (ref 0.7–4.0)
Lymphocytes Relative: 34 %
MCH: 31 pg (ref 26.0–34.0)
MCHC: 31.7 g/dL (ref 30.0–36.0)
MCV: 97.5 fL (ref 80.0–100.0)
MONO ABS: 0.2 10*3/uL (ref 0.1–1.0)
MONOS PCT: 8 %
NEUTROS ABS: 1.5 10*3/uL — AB (ref 1.7–7.7)
NEUTROS PCT: 55 %
Platelet Count: 330 10*3/uL (ref 150–400)
RBC: 3.23 MIL/uL — ABNORMAL LOW (ref 3.87–5.11)
RDW: 14.6 % (ref 11.5–15.5)
WBC: 2.6 10*3/uL — AB (ref 4.0–10.5)
nRBC: 0 % (ref 0.0–0.2)

## 2018-01-23 LAB — FERRITIN: Ferritin: 90 ng/mL (ref 11–307)

## 2018-01-23 LAB — IRON AND TIBC
Iron: 25 ug/dL — ABNORMAL LOW (ref 41–142)
SATURATION RATIOS: 12 % — AB (ref 21–57)
TIBC: 214 ug/dL — AB (ref 236–444)
UIBC: 189 ug/dL (ref 120–384)

## 2018-01-26 ENCOUNTER — Telehealth: Payer: Self-pay

## 2018-01-26 NOTE — Telephone Encounter (Signed)
-----   Message from Alla Feeling, NP sent at 01/26/2018  1:02 PM EST ----- Please let her know labs. Iron studies did not improve much after IV Feraheme 2 weeks ago. She has labs again later this month, if still not much improvement, we may recommend another dose of Feraheme. Please have her continue oral iron in the meantime. We will continue monitoring.  Thanks, Regan Rakers

## 2018-01-26 NOTE — Telephone Encounter (Signed)
Spoke with patient with lab results per Sierra Rue NP iron studies did not improve much after IV feraheme 2 weeks ago.  She is only taking oral iron once a day (supposed to be 3 times a day) instructed to increase to at least twice daily between now and her repeat lab appointment.  She verbalized an understanding and will do so.

## 2018-02-02 ENCOUNTER — Other Ambulatory Visit: Payer: Self-pay | Admitting: Radiology

## 2018-02-05 ENCOUNTER — Other Ambulatory Visit: Payer: Self-pay

## 2018-02-05 ENCOUNTER — Ambulatory Visit (HOSPITAL_COMMUNITY)
Admission: RE | Admit: 2018-02-05 | Discharge: 2018-02-05 | Disposition: A | Discharge status: Home / Self Care | Payer: BLUE CROSS/BLUE SHIELD | Source: Ambulatory Visit | Attending: Diagnostic Radiology | Admitting: Diagnostic Radiology

## 2018-02-05 ENCOUNTER — Encounter (HOSPITAL_COMMUNITY): Payer: Self-pay

## 2018-02-05 ENCOUNTER — Observation Stay (HOSPITAL_COMMUNITY)
Admission: RE | Admit: 2018-02-05 | Discharge: 2018-02-06 | Disposition: A | Discharge status: Home / Self Care | Payer: BLUE CROSS/BLUE SHIELD | Source: Ambulatory Visit | Attending: Diagnostic Radiology | Admitting: Diagnostic Radiology

## 2018-02-05 DIAGNOSIS — Z793 Long term (current) use of hormonal contraceptives: Secondary | ICD-10-CM | POA: Insufficient documentation

## 2018-02-05 DIAGNOSIS — Z9889 Other specified postprocedural states: Secondary | ICD-10-CM

## 2018-02-05 DIAGNOSIS — Z8249 Family history of ischemic heart disease and other diseases of the circulatory system: Secondary | ICD-10-CM | POA: Insufficient documentation

## 2018-02-05 DIAGNOSIS — D509 Iron deficiency anemia, unspecified: Secondary | ICD-10-CM | POA: Insufficient documentation

## 2018-02-05 DIAGNOSIS — N920 Excessive and frequent menstruation with regular cycle: Secondary | ICD-10-CM | POA: Diagnosis present

## 2018-02-05 DIAGNOSIS — D259 Leiomyoma of uterus, unspecified: Principal | ICD-10-CM | POA: Insufficient documentation

## 2018-02-05 HISTORY — PX: IR ANGIOGRAM PELVIS SELECTIVE OR SUPRASELECTIVE: IMG661

## 2018-02-05 HISTORY — PX: IR ANGIOGRAM SELECTIVE EACH ADDITIONAL VESSEL: IMG667

## 2018-02-05 HISTORY — PX: IR EMBO TUMOR ORGAN ISCHEMIA INFARCT INC GUIDE ROADMAPPING: IMG5449

## 2018-02-05 HISTORY — PX: IR US GUIDE VASC ACCESS RIGHT: IMG2390

## 2018-02-05 LAB — BASIC METABOLIC PANEL
Anion gap: 8 (ref 5–15)
BUN: 12 mg/dL (ref 6–20)
CO2: 23 mmol/L (ref 22–32)
Calcium: 8.4 mg/dL — ABNORMAL LOW (ref 8.9–10.3)
Chloride: 108 mmol/L (ref 98–111)
Creatinine, Ser: 0.65 mg/dL (ref 0.44–1.00)
GFR calc Af Amer: >60 mL/min (ref >60)
GFR calc non Af Amer: >60 mL/min (ref >60)
Glucose, Bld: 79 mg/dL (ref 70–99)
Potassium: 4 mmol/L (ref 3.5–5.1)
Sodium: 139 mmol/L (ref 135–145)

## 2018-02-05 LAB — TYPE AND SCREEN
ABO/RH(D): O POS
Antibody Screen: NEGATIVE

## 2018-02-05 LAB — CBC WITH DIFFERENTIAL/PLATELET
Abs Immature Granulocytes: 0 10*3/uL (ref 0.00–0.07)
Basophils Absolute: 0.1 10*3/uL (ref 0.0–0.1)
Basophils Relative: 3 %
Eosinophils Absolute: 0.1 10*3/uL (ref 0.0–0.5)
Eosinophils Relative: 3 %
HCT: 34.4 % — ABNORMAL LOW (ref 36.0–46.0)
Hemoglobin: 10.5 g/dL — ABNORMAL LOW (ref 12.0–15.0)
IMMATURE GRANULOCYTES: 0 %
LYMPHS PCT: 37 %
Lymphs Abs: 1 10*3/uL (ref 0.7–4.0)
MCH: 30.6 pg (ref 26.0–34.0)
MCHC: 30.5 g/dL (ref 30.0–36.0)
MCV: 100.3 fL — ABNORMAL HIGH (ref 80.0–100.0)
Monocytes Absolute: 0.3 10*3/uL (ref 0.1–1.0)
Monocytes Relative: 10 %
Neutro Abs: 1.3 10*3/uL — ABNORMAL LOW (ref 1.7–7.7)
Neutrophils Relative %: 47 %
Platelets: 340 10*3/uL (ref 150–400)
RBC: 3.43 MIL/uL — ABNORMAL LOW (ref 3.87–5.11)
RDW: 14.3 % (ref 11.5–15.5)
WBC: 2.8 10*3/uL — ABNORMAL LOW (ref 4.0–10.5)
nRBC: 0 % (ref 0.0–0.2)

## 2018-02-05 LAB — HCG, SERUM, QUALITATIVE: PREG SERUM: NEGATIVE

## 2018-02-05 LAB — PROTIME-INR
INR: 1.03
Prothrombin Time: 13.4 seconds (ref 11.4–15.2)

## 2018-02-05 LAB — ABO/RH: ABO/RH(D): O POS

## 2018-02-05 MED ORDER — MIDAZOLAM HCL 2 MG/2ML IJ SOLN
INTRAMUSCULAR | Status: AC
  Filled 2018-02-05: qty 4

## 2018-02-05 MED ORDER — KETOROLAC TROMETHAMINE 30 MG/ML IJ SOLN
30.0000 mg | Freq: Four times a day (QID) | INTRAMUSCULAR | Status: DC
  Administered 2018-02-05 – 2018-02-06 (×2): 30 mg via INTRAVENOUS
  Filled 2018-02-05 (×3): qty 1

## 2018-02-05 MED ORDER — SODIUM CHLORIDE 0.9 % IV SOLN: 250.0000 mL | INTRAVENOUS | Status: DC | PRN

## 2018-02-05 MED ORDER — DEXTROSE 5 % IV SOLN
3.0000 g | INTRAVENOUS | Status: AC
  Administered 2018-02-05: 3 g via INTRAVENOUS
  Filled 2018-02-05: qty 3

## 2018-02-05 MED ORDER — NALOXONE HCL 0.4 MG/ML IJ SOLN: 0.4000 mg | INTRAMUSCULAR | Status: DC | PRN

## 2018-02-05 MED ORDER — MIDAZOLAM HCL 2 MG/2ML IJ SOLN
INTRAMUSCULAR | Status: AC | PRN
  Administered 2018-02-05 (×12): 1 mg via INTRAVENOUS

## 2018-02-05 MED ORDER — DIPHENHYDRAMINE HCL 12.5 MG/5ML PO ELIX
12.5000 mg | ORAL_SOLUTION | Freq: Four times a day (QID) | ORAL | Status: DC | PRN
  Filled 2018-02-05: qty 5

## 2018-02-05 MED ORDER — LIDOCAINE HCL 1 % IJ SOLN
INTRAMUSCULAR | Status: AC
  Filled 2018-02-05: qty 20

## 2018-02-05 MED ORDER — PROMETHAZINE HCL 25 MG RE SUPP: 25.0000 mg | Freq: Three times a day (TID) | RECTAL | Status: DC | PRN

## 2018-02-05 MED ORDER — PROMETHAZINE HCL 25 MG PO TABS
25.0000 mg | ORAL_TABLET | Freq: Three times a day (TID) | ORAL | Status: DC | PRN
  Filled 2018-02-05: qty 1

## 2018-02-05 MED ORDER — MIDAZOLAM HCL 2 MG/2ML IJ SOLN
INTRAMUSCULAR | Status: AC
  Filled 2018-02-05: qty 2

## 2018-02-05 MED ORDER — SODIUM CHLORIDE 0.9 % IV SOLN
8.0000 mg | Freq: Once | INTRAVENOUS | Status: AC
  Administered 2018-02-05: 8 mg via INTRAVENOUS
  Filled 2018-02-05: qty 4

## 2018-02-05 MED ORDER — KETOROLAC TROMETHAMINE 30 MG/ML IJ SOLN
30.0000 mg | INTRAMUSCULAR | Status: AC
  Administered 2018-02-05: 30 mg via INTRAVENOUS
  Filled 2018-02-05: qty 1

## 2018-02-05 MED ORDER — IOHEXOL 300 MG/ML  SOLN
100.0000 mL | Freq: Once | INTRAMUSCULAR | Status: AC | PRN
  Administered 2018-02-05: 85 mL via INTRA_ARTERIAL

## 2018-02-05 MED ORDER — SODIUM CHLORIDE 0.9% FLUSH: 3.0000 mL | Freq: Two times a day (BID) | INTRAVENOUS | Status: DC

## 2018-02-05 MED ORDER — FENTANYL CITRATE (PF) 100 MCG/2ML IJ SOLN
INTRAMUSCULAR | Status: AC
  Filled 2018-02-05: qty 4

## 2018-02-05 MED ORDER — ONDANSETRON HCL 4 MG/2ML IJ SOLN: 4.0000 mg | Freq: Four times a day (QID) | INTRAMUSCULAR | Status: DC | PRN

## 2018-02-05 MED ORDER — IOPAMIDOL (ISOVUE-300) INJECTION 61%
INTRAVENOUS | Status: AC
  Administered 2018-02-05: 8 mL
  Filled 2018-02-05: qty 50

## 2018-02-05 MED ORDER — ONDANSETRON HCL 4 MG/2ML IJ SOLN
4.0000 mg | Freq: Four times a day (QID) | INTRAMUSCULAR | Status: DC | PRN
  Administered 2018-02-05: 4 mg via INTRAVENOUS
  Filled 2018-02-05: qty 2

## 2018-02-05 MED ORDER — DIPHENHYDRAMINE HCL 50 MG/ML IJ SOLN: 12.5000 mg | Freq: Four times a day (QID) | INTRAMUSCULAR | Status: DC | PRN

## 2018-02-05 MED ORDER — SODIUM CHLORIDE 0.9 % IV SOLN
INTRAVENOUS | Status: DC
  Administered 2018-02-05: 09:00:00 via INTRAVENOUS

## 2018-02-05 MED ORDER — FENTANYL CITRATE (PF) 100 MCG/2ML IJ SOLN
INTRAMUSCULAR | Status: AC
  Filled 2018-02-05: qty 2

## 2018-02-05 MED ORDER — DOCUSATE SODIUM 100 MG PO CAPS
100.0000 mg | ORAL_CAPSULE | Freq: Two times a day (BID) | ORAL | Status: DC
  Administered 2018-02-05: 100 mg via ORAL
  Filled 2018-02-05: qty 1

## 2018-02-05 MED ORDER — FENTANYL CITRATE (PF) 100 MCG/2ML IJ SOLN
INTRAMUSCULAR | Status: AC | PRN
  Administered 2018-02-05: 50 ug via INTRAVENOUS
  Administered 2018-02-05: 25 ug via INTRAVENOUS
  Administered 2018-02-05 (×3): 50 ug via INTRAVENOUS
  Administered 2018-02-05: 25 ug via INTRAVENOUS
  Administered 2018-02-05: 50 ug via INTRAVENOUS

## 2018-02-05 MED ORDER — HYDROMORPHONE 1 MG/ML IV SOLN
INTRAVENOUS | Status: DC
  Administered 2018-02-05: 30 mg via INTRAVENOUS
  Administered 2018-02-05: 3.6 mg via INTRAVENOUS
  Administered 2018-02-05: 1.5 mL via INTRAVENOUS
  Administered 2018-02-06: 1.5 mg via INTRAVENOUS
  Administered 2018-02-06: 0 mg via INTRAVENOUS
  Filled 2018-02-05: qty 25

## 2018-02-05 MED ORDER — SODIUM CHLORIDE 0.9% FLUSH: 9.0000 mL | INTRAVENOUS | Status: DC | PRN

## 2018-02-05 MED ORDER — KETOROLAC TROMETHAMINE 30 MG/ML IJ SOLN
30.0000 mg | Freq: Once | INTRAMUSCULAR | Status: AC
  Administered 2018-02-05: 30 mg via INTRAVENOUS
  Filled 2018-02-05: qty 1

## 2018-02-05 MED ORDER — IOHEXOL 300 MG/ML  SOLN
100.0000 mL | Freq: Once | INTRAMUSCULAR | Status: AC | PRN
  Administered 2018-02-05: 100 mL via INTRAVENOUS

## 2018-02-05 MED ORDER — SODIUM CHLORIDE 0.9% FLUSH: 3.0000 mL | INTRAVENOUS | Status: DC | PRN

## 2018-02-05 NOTE — H&P (Signed)
Chief Complaint: Patient was seen in consultation today for [place].  Referring Physician(s): Henn,Adam  Supervising Physician: Markus Daft  Patient Status: [facility] - Out-pt  History of Present Illness: Sierra Mejia is a 37 y.o. female with a past medical history significant for uterine fibroids and anemia who was seen in IR clinic on 12/14/17 by Dr. Anselm Pancoast for consultation for [place]. Patient has a history of uterine fibroids and menorrhagia for many years, she underwent myomectomy in 2014 due to menorrhagia. Her symptoms improved for about a year however she began to experience worse bleeding than prior to myomectomy, reporting 5 days of menstrual bleeding with 2 days very heavy, with clots and cramping - she also experiences daily spotting between menstruation. She also is anemic and receives iron transfusions periodically as well as taking daily iron tablets. She is followed by Dr. Burr Medico for anemia. She has also recently started Lupron via her gynecologist. She presents today for planned uterine artery embolization with Dr. Anselm Pancoast.  Patient denies any complaints currently, she is worried about post procedure and how long the procedure will take. She is concerned about the type of pain medication she will receive both while in the hospital and after returning home as she states she has a high pain tolerance. She states she understands the procedure after discussion with Dr. Anselm Pancoast in outpatient clinic and wishes to proceed.   Past Medical History:  Diagnosis Date  . Anemia   . Fibroids   . Wears contact lenses     Past Surgical History:  Procedure Laterality Date  . IR RADIOLOGIST EVAL & MGMT  12/14/2017  . MYOMECTOMY    . ORIF ANKLE FRACTURE Left 02/25/2015   Procedure: OPEN REDUCTION INTERNAL FIXATION (ORIF) ANKLE FRACTURE LEFT FIBULAR;  Surgeon: Samara Deist, DPM;  Location: [facility];  Service: Podiatry;  Laterality: Left;  WITH POPLITEAL    Allergies: Patient has no known  allergies.  Medications: Prior to Admission medications   Medication Sig Start Date End Date Taking? Authorizing Provider  Ferrous Sulfate (IRON) 325 (65 Fe) MG TABS TAKE 1 TABLET BY MOUTH THREE TIMES DAILY WITH MEALS 11/30/17  Yes Anyanwu, Ugonna A, MD  ibuprofen (ADVIL,MOTRIN) 200 MG tablet Take 200 mg by mouth every 6 (six) hours as needed.    [provider]  medroxyPROGESTERone (PROVERA) 10 MG tablet Take 10 mg by mouth daily.    [provider]     Family History  Problem Relation Age of Onset  . Hypertension Father   . Cancer Neg Hx     Social History   Socioeconomic History  . Marital status: Single    Spouse name: Not on file  . Number of children: 0  . Years of education: Not on file  . Highest education level: Not on file  Occupational History  . Not on file  Social Needs  . Financial resource strain: Not on file  . Food insecurity:    Worry: Not on file    Inability: Not on file  . Transportation needs:    Medical: Not on file    Non-medical: Not on file  Tobacco Use  . Smoking status: Never Smoker  . Smokeless tobacco: Never Used  Substance and Sexual Activity  . Alcohol use: Yes    Comment: social   . Drug use: Not Currently  . Sexual activity: Not on file  Lifestyle  . Physical activity:    Days per week: Not on file    Minutes  per session: Not on file  . Stress: Not on file  Relationships  . Social connections:    Talks on phone: Not on file    Gets together: Not on file    Attends religious service: Not on file    Active member of club or organization: Not on file    Attends meetings of clubs or organizations: Not on file    Relationship status: Not on file  Other Topics Concern  . Not on file  Social History Narrative  . Not on file     Review of Systems: A 12 point ROS discussed and pertinent positives are indicated in the HPI above.  All other systems are negative.  Review of Systems  Constitutional: Negative for  chills and fever.  Respiratory: Negative for cough and shortness of breath.   Cardiovascular: Negative for chest pain.  Gastrointestinal: Negative for abdominal pain, blood in stool, diarrhea, nausea and vomiting.  Genitourinary: Negative for difficulty urinating, dysuria and hematuria.  Skin: Negative for rash.  Neurological: Negative for dizziness and syncope.  Psychiatric/Behavioral: Negative for confusion.    Vital Signs:   Physical Exam Vitals signs and nursing note reviewed.  Constitutional:      General: She is not in acute distress.    Appearance: Normal appearance. She is not diaphoretic.     Comments: Sister at bedside during exam.  HENT:     Head: Normocephalic and atraumatic.  Cardiovascular:     Rate and Rhythm: Normal rate and regular rhythm.  Pulmonary:     Effort: Pulmonary effort is normal.     Breath sounds: Normal breath sounds.  Abdominal:     General: There is no distension.     Palpations: Abdomen is soft.     Tenderness: There is no abdominal tenderness.  Skin:    General: Skin is warm and dry.  Neurological:     Mental Status: She is alert and oriented to person, place, and time.  Psychiatric:        Mood and Affect: Mood normal.        Behavior: Behavior normal.        Thought Content: Thought content normal.        Judgment: Judgment normal.      MD Evaluation Airway: WNL Heart: WNL Abdomen: WNL ASA  Classification: 2 Mallampati/Airway Score: One   Imaging: No results found.  Labs:  CBC: Recent Labs    11/29/17 0352 12/26/17 1211 01/23/18 1151 02/05/18 0902  WBC 4.0 3.1* 2.6* 2.8*  HGB 8.2* 9.5* 10.0* 10.5*  HCT 27.3* 31.1* 31.5* 34.4*  PLT 338 410* 330 340    COAGS: Recent Labs    02/05/18 0902  INR 1.03    BMP: Recent Labs    11/29/17 0352 02/05/18 0902  NA 144 139  K 3.9 4.0  CL 111 108  CO2 25 23  GLUCOSE 79 79  BUN 12 12  CALCIUM 8.8* 8.4*  CREATININE 0.67 0.65  GFRNONAA >60 >60  GFRAA >60 >60     LIVER FUNCTION TESTS: Recent Labs    11/29/17 0352  BILITOT 0.7  AST 20  ALT 16  ALKPHOS 56  PROT 6.3*  ALBUMIN 3.5    TUMOR MARKERS: No results for input(s): AFPTM, CEA, CA199, CHROMGRNA in the last 8760 hours.  Assessment and Plan:  Patient with history of uterine fibroids s/p myomectomy 2014, menorrhagia and iron deficiency anemia requiring IV infusions followed by Dr. Burr Medico who presents today for planned uterine  artery embolization with Dr. Anselm Pancoast.   Patient has been NPO since 8 pm last evening, she does not take blood thinning medications, she did not take any medications this morning. She understands that she will be admitted to the hospital following this procedure for observation/pain management. Afebrile, WBC 2.8, hgb 10.5, plt 340, INR 1.03, creatinine 0.65.  The Risks and benefits of embolization were discussed with the patient including, but not limited to bleeding, infection, vascular injury, post operative pain, or contrast induced renal failure.  This procedure involves the use of X-rays and because of the nature of the planned procedure, it is possible that we will have prolonged use of X-ray fluoroscopy.  Potential radiation risks to you include (but are not limited to) the following: - A slightly elevated risk for cancer several years later in life. This risk is typically less than 0.5% percent. This risk is low in comparison to the normal incidence of human cancer, which is 33% for women and 50% for men according to the [place]. - Radiation induced injury can include skin redness, resembling a rash, tissue breakdown / ulcers and hair loss (which can be temporary or permanent).  The likelihood of either of these occurring depends on the difficulty of the procedure and whether you are sensitive to radiation due to previous procedures, disease, or genetic conditions.  IF your procedure requires a prolonged use of radiation, you will be notified  and given written instructions for further action. It is your responsibility to monitor the irradiated area for the 2 weeks following the procedure and to notify your physician if you are concerned that you have suffered a radiation induced injury.   All of the patient's questions were answered, patient is agreeable to proceed.  Consent signed and in chart.  Thank you for this interesting consult.  I greatly enjoyed meeting Sierra Mejia and look forward to participating in their care.  A copy of this report was sent to the requesting provider on this date.  Electronically Signed: Joaquim Nam, PA-C 02/05/2018, 9:18 AM   I spent a total of   25 Minutes in face to face in clinical consultation, greater than 50% of which was counseling/coordinating care for uterine artery embolization.

## 2018-02-05 NOTE — Discharge Instructions (Addendum)
Uterine Artery Embolization for Fibroids, Care After Refer to this sheet in the next few weeks. These instructions provide you with information on caring for yourself after your procedure. Your health care provider may also give you more specific instructions. Your treatment has been planned according to current medical practices, but problems sometimes occur. Call your health care provider if you have any problems or questions after your procedure. What can I expect after the procedure? After your procedure, it is typical to have cramping in the pelvis. You will be given pain medicine to control it. Follow these instructions at home:  Only take over-the-counter or prescription medicines for pain, discomfort, or fever as directed by your health care provider.  Do not take aspirin. It can cause bleeding.  Follow your health care providers advice regarding medicines given to you, diet, activity, and when to begin sexual activity.  See your health care provider for follow-up care as directed. Contact a health care provider if:  You have a fever.  You have redness, swelling, and pain around your incision site.  You have pus draining from your incision.  You have a rash. Get help right away if:  You have bleeding from your incision site.  You have difficulty breathing.  You have chest pain.  You have severe abdominal pain.  You have leg pain.  You become dizzy and faint. This information is not intended to replace advice given to you by your health care provider. Make sure you discuss any questions you have with your health care provider. Document Released: 11/28/2012 Document Revised: 07/16/2015 Document Reviewed: 08/23/2012 Elsevier Interactive Patient Education  2018 [place]. Moderate Conscious Sedation, Adult, Care After These instructions provide you with information about caring for yourself after your procedure. Your health care provider may also give you more specific  instructions. Your treatment has been planned according to current medical practices, but problems sometimes occur. Call your health care provider if you have any problems or questions after your procedure. What can I expect after the procedure? After your procedure, it is common:  To feel sleepy for several hours.  To feel clumsy and have poor balance for several hours.  To have poor judgment for several hours.  To vomit if you eat too soon.  Follow these instructions at home: For at least 24 hours after the procedure:   Do not: ? Participate in activities where you could fall or become injured. ? Drive. ? Use heavy machinery. ? Drink alcohol. ? Take sleeping pills or medicines that cause drowsiness. ? Make important decisions or sign legal documents. ? Take care of children on your own.  Rest. Eating and drinking  Follow the diet recommended by your health care provider.  If you vomit: ? Drink water, juice, or soup when you can drink without vomiting. ? Make sure you have little or no nausea before eating solid foods. General instructions  Have a responsible adult stay with you until you are awake and alert.  Take over-the-counter and prescription medicines only as told by your health care provider.  If you smoke, do not smoke without supervision.  Keep all follow-up visits as told by your health care provider. This is important. Contact a health care provider if:  You keep feeling nauseous or you keep vomiting.  You feel light-headed.  You develop a rash.  You have a fever. Get help right away if:  You have trouble breathing. This information is not intended to replace advice given to you  by your health care provider. Make sure you discuss any questions you have with your health care provider. Document Released: 11/28/2012 Document Revised: 07/13/2015 Document Reviewed: 05/30/2015 Elsevier Interactive Patient Education  [employer].

## 2018-02-05 NOTE — Progress Notes (Signed)
Supervising Physician: Markus Daft  Patient Status:  [facility] - In-pt  Chief Complaint: Follow up successful [place] today with Dr. Anselm Pancoast  Subjective:  Patient sleeping lightly upon entry to room, arouses easily with verbal cues. States pain 8/10 earlier, improved somewhat after IV toradol about 30 minutes ago. She has some nausea without vomiting which is new as of about an hour ago.   Allergies: Patient has no known allergies.  Medications: Prior to Admission medications   Medication Sig Start Date End Date Taking? Authorizing Provider  Ferrous Sulfate (IRON) 325 (65 Fe) MG TABS TAKE 1 TABLET BY MOUTH THREE TIMES DAILY WITH MEALS 11/30/17  Yes Anyanwu, Ugonna A, MD  ibuprofen (ADVIL,MOTRIN) 200 MG tablet Take 200 mg by mouth every 6 (six) hours as needed.   Yes [provider]  leuprolide (LUPRON DEPOT, 47-MONTH,) 11.25 MG injection Inject 1 kit into the muscle every 3 (three) months.   Yes [provider]     Vital Signs: BP 127/80   Pulse (!) 57   Temp 97.7 F (36.5 C) (Oral)   Resp 15   SpO2 100%   Physical Exam Vitals signs and nursing note reviewed.  Constitutional:      General: She is not in acute distress. HENT:     Head: Normocephalic.  Cardiovascular:     Pulses:          Dorsalis pedis pulses are 2+ on the right side and 2+ on the left side.       Posterior tibial pulses are 1+ on the right side and 1+ on the left side.  Pulmonary:     Effort: Pulmonary effort is normal.  Skin:    General: Skin is warm and dry.     Comments: Right CFA puncture site clean, dry, intact, dressed appropriately, soft without hematoma or bleeding.  Neurological:     Mental Status: She is alert. Mental status is at baseline.     Imaging: No results found.  Labs:  CBC: Recent Labs    11/29/17 0352 12/26/17 1211 01/23/18 1151 02/05/18 0902  WBC 4.0 3.1* 2.6* 2.8*  HGB 8.2* 9.5* 10.0* 10.5*  HCT 27.3* 31.1* 31.5* 34.4*  PLT 338 410* 330 340     COAGS: Recent Labs    02/05/18 0902  INR 1.03    BMP: Recent Labs    11/29/17 0352 02/05/18 0902  NA 144 139  K 3.9 4.0  CL 111 108  CO2 25 23  GLUCOSE 79 79  BUN 12 12  CALCIUM 8.8* 8.4*  CREATININE 0.67 0.65  GFRNONAA >60 >60  GFRAA >60 >60    LIVER FUNCTION TESTS: Recent Labs    11/29/17 0352  BILITOT 0.7  AST 20  ALT 16  ALKPHOS 56  PROT 6.3*  ALBUMIN 3.5    Assessment and Plan:  Patient s/p successful [place] today with Dr. Anselm Pancoast - 8/10 pain post procedure, improved with IV toradol. Puncture site soft, without active bleeding, dressed appropriately. Distal pulses intact.  Will order Toradol 30 mg Q6H in addition to dilaudid PCA for pain control, OOB with assistance as tolerated, may remove foley, advance diet as tolerated.  IR will follow up with patient tomorrow for d/c if stable.  Please call IR with questions or concerns.   Electronically Signed: Joaquim Nam, PA-C 02/05/2018, 5:41 PM   I spent a total of 15 Minutes at the the patient's bedside AND on the patient's hospital floor or unit, greater than 50%  of which was counseling/coordinating care for [place] follow up.

## 2018-02-05 NOTE — Procedures (Signed)
Interventional Radiology Procedure:   Indications: Fibroids and menorrhagia  Procedure: Bilateral uterine artery embolization  Findings: Large bilateral uterine arteries and successful embolization  Complications: None     EBL: Minimal  Plan: Observation overnight for pain control.     Carsten Carstarphen R. Anselm Pancoast, MD  Pager: [phone]

## 2018-02-06 ENCOUNTER — Other Ambulatory Visit: Payer: Self-pay

## 2018-02-06 DIAGNOSIS — D259 Leiomyoma of uterus, unspecified: Secondary | ICD-10-CM | POA: Diagnosis not present

## 2018-02-06 MED ORDER — IBUPROFEN 200 MG PO TABS: 600.0000 mg | ORAL_TABLET | Freq: Four times a day (QID) | ORAL | 0 refills | Status: AC | PRN

## 2018-02-06 MED ORDER — ONDANSETRON HCL 8 MG PO TABS: 8.0000 mg | ORAL_TABLET | Freq: Two times a day (BID) | ORAL | 0 refills | Status: DC

## 2018-02-06 MED ORDER — IBUPROFEN 800 MG PO TABS
800.0000 mg | ORAL_TABLET | Freq: Three times a day (TID) | ORAL | Status: DC | PRN
  Administered 2018-02-06 (×2): 800 mg via ORAL
  Filled 2018-02-06 (×2): qty 1

## 2018-02-06 NOTE — Discharge Summary (Addendum)
Patient ID: Sierra Mejia MRN: [id number] DOB/AGE: [birth year] 39 y.o.  Admit date: 02/05/2018 Discharge date: 02/06/2018  Supervising Physician: Markus Daft  Patient Status: [facility] - In-pt  Admission Diagnoses: Symptomatic uterine fibroids    Discharge Diagnoses: Symptomatic uterine fibroids, status post successful bilateral uterine artery embolization on 02/05/2018 via IV conscious sedation  Fibroids, status post Active Problems:   Status post embolization of uterine artery   Menorrhagia  Past Medical History:  Diagnosis Date  . Anemia   . Fibroids   . Wears contact lenses    Past Surgical History:  Procedure Laterality Date  . IR ANGIOGRAM PELVIS SELECTIVE OR SUPRASELECTIVE  02/05/2018  . IR ANGIOGRAM PELVIS SELECTIVE OR SUPRASELECTIVE  02/05/2018  . IR ANGIOGRAM SELECTIVE EACH ADDITIONAL VESSEL  02/05/2018  . IR ANGIOGRAM SELECTIVE EACH ADDITIONAL VESSEL  02/05/2018  . IR EMBO TUMOR ORGAN ISCHEMIA INFARCT INC GUIDE ROADMAPPING  02/05/2018  . IR RADIOLOGIST EVAL & MGMT  12/14/2017  . IR US GUIDE VASC ACCESS RIGHT  02/05/2018  . MYOMECTOMY    . ORIF ANKLE FRACTURE Left 02/25/2015   Procedure: OPEN REDUCTION INTERNAL FIXATION (ORIF) ANKLE FRACTURE LEFT FIBULAR;  Surgeon: Samara Deist, DPM;  Location: [facility];  Service: Podiatry;  Laterality: Left;  WITH POPLITEAL     Discharged Condition: good  Hospital Course: Sierra Mejia is a 37 year old female with history of symptomatic uterine fibroids who was referred to the interventional radiology department to discuss treatment treatment options on 12/14/2017.  She was deemed an appropriate candidate for bilateral uterine artery embolization and presented to [facility] on 02/05/2018 for the procedure.  The procedure was performed by Dr. Markus Daft without immediate complications and she was subsequently admitted for overnight observation for pain control.  She was placed on Dilaudid PCA and given antiemetics as  needed.  Overnight the patient did fairly well with exception of some intermittent pelvic cramping as expected and occasional nausea.  On the morning of discharge she was stable.  She continues to have some mild pelvic cramping but denies fever, headache, chest pain, dyspnea, cough, back pain, vomiting. She did notice a small amount of bloody discharge upon urination. She was deemed stable for discharge at this time.  She will be scheduled for follow-up with Dr.Henn in the [facility] clinic in 3 to 4 weeks.  She will continue to follow-up with Dr. Leo Grosser and Burr Medico as scheduled.  She will remain on current home medications. She was given prescriptions for Advil and Zofran. She declined prescriptions for stool softener and narcotic.  Consults: none  Significant Diagnostic Studies:  Results for orders placed or performed during the hospital encounter of 62/83/15  Basic metabolic panel  Result Value Ref Range   Sodium 139 135 - 145 mmol/L   Potassium 4.0 3.5 - 5.1 mmol/L   Chloride 108 98 - 111 mmol/L   CO2 23 22 - 32 mmol/L   Glucose, Bld 79 70 - 99 mg/dL   BUN 12 6 - 20 mg/dL   Creatinine, Ser 0.65 0.44 - 1.00 mg/dL   Calcium 8.4 (L) 8.9 - 10.3 mg/dL   GFR calc non Af Amer >60 >60 mL/min   GFR calc Af Amer >60 >60 mL/min   Anion gap 8 5 - 15  CBC with Differential/Platelet  Result Value Ref Range   WBC 2.8 (L) 4.0 - 10.5 K/uL   RBC 3.43 (L) 3.87 - 5.11 MIL/uL   Hemoglobin 10.5 (L) 12.0 - 15.0 g/dL   HCT 34.4 (  L) 36.0 - 46.0 %   MCV 100.3 (H) 80.0 - 100.0 fL   MCH 30.6 26.0 - 34.0 pg   MCHC 30.5 30.0 - 36.0 g/dL   RDW 14.3 11.5 - 15.5 %   Platelets 340 150 - 400 K/uL   nRBC 0.0 0.0 - 0.2 %   Neutrophils Relative % 47 %   Neutro Abs 1.3 (L) 1.7 - 7.7 K/uL   Lymphocytes Relative 37 %   Lymphs Abs 1.0 0.7 - 4.0 K/uL   Monocytes Relative 10 %   Monocytes Absolute 0.3 0.1 - 1.0 K/uL   Eosinophils Relative 3 %   Eosinophils Absolute 0.1 0.0 - 0.5 K/uL   Basophils Relative 3 %   Basophils  Absolute 0.1 0.0 - 0.1 K/uL   Immature Granulocytes 0 %   Abs Immature Granulocytes 0.00 0.00 - 0.07 K/uL  hCG, serum, qualitative  Result Value Ref Range   Preg, Serum NEGATIVE NEGATIVE  Protime-INR  Result Value Ref Range   Prothrombin Time 13.4 11.4 - 15.2 seconds   INR 1.03   Type and screen  Result Value Ref Range   ABO/RH(D) O POS    Antibody Screen NEG    Sample Expiration      02/08/2018 Performed at [facility], [place] [address] [place], [place] [address]      Treatments: Successful bilateral uterine artery embolization on 02/05/2018  Discharge Exam: Blood pressure 115/75, pulse (!) 55, temperature 97.9 F (36.6 C), resp. rate 16, SpO2 100 %. Patient awake, alert.  Chest clear to auscultation bilaterally.  Heart with regular rate and rhythm.  Abdomen soft, positive bowel sounds, fibroid uterus, currently nontender.  Puncture site right common femoral artery clean, dry, soft, nontender, no hematoma.  Intact distal pulses.  No significant lower extremity edema.  Disposition: Discharge disposition: 01-Home or Self Care       Discharge Instructions    Call MD for:  difficulty breathing, headache or visual disturbances   Complete by:  As directed    Call MD for:  extreme fatigue   Complete by:  As directed    Call MD for:  hives   Complete by:  As directed    Call MD for:  persistant dizziness or light-headedness   Complete by:  As directed    Call MD for:  persistant nausea and vomiting   Complete by:  As directed    Call MD for:  redness, tenderness, or signs of infection (pain, swelling, redness, odor or green/yellow discharge around incision site)   Complete by:  As directed    Call MD for:  severe uncontrolled pain   Complete by:  As directed    Call MD for:  temperature >100.4   Complete by:  As directed    Change dressing (specify)   Complete by:  As directed    May apply new bandage to puncture site right groin for next 2 to 3 days.   May change daily.  May wash site with soap and water.   Diet - low sodium heart healthy   Complete by:  As directed    Driving Restrictions   Complete by:  As directed    No driving for next 24 hours   Increase activity slowly   Complete by:  As directed    Lifting restrictions   Complete by:  As directed    No heavy lifting for the next 3 to 4 days   May shower / Bathe  Complete by:  As directed    May walk up steps   Complete by:  As directed    Sexual Activity Restrictions   Complete by:  As directed    No sexual intercourse for 1 week      Follow-up Information    Markus Daft, MD Follow up.   Specialties:  Interventional Radiology, Radiology Why:  Radiology service will call you with follow-up appointment with Dr.Henn in the next 3 to 4 weeks.  Call [phone] or [phone] with any questions. Contact information: [place] [address] [phone]        Eldred Manges, MD Follow up.   Specialty:  Obstetrics and Gynecology Why:  Continue to follow-up with Dr. Leo Grosser as scheduled Contact information: [address] [place] [address] [phone]        Truitt Merle, MD Follow up.   Specialties:  Hematology, Oncology Why:  Continue follow-up with Dr. Burr Medico as scheduled Contact information: [place] [address] [phone]            Electronically Signed: D. Rowe Robert, PA-C 02/06/2018, 12:01 PM   I have spent Less Than 30 Minutes discharging Sierra Mejia.

## 2018-02-06 NOTE — Care Management Note (Signed)
Case Management Note  Patient Details  Name: EYANA STOLZE MRN: [id number] Date of Birth: 13-Jun-1980  Subjective/Objective:                  discharge  Action/Plan: Discharged to home with self-care, orders checked for hhc needs. No CM needs present at time of discharge.  Patient is able to arrangement own appointments and home care.  Expected Discharge Date:  02/06/18               Expected Discharge Plan:  Home/Self Care  In-House Referral:     Discharge planning Services  CM Consult  Post Acute Care Choice:    Choice offered to:     DME Arranged:    DME Agency:     HH Arranged:    HH Agency:     Status of Service:  Completed, signed off  If discussed at [employer] of Stay Meetings, dates discussed:    Additional Comments:  Leeroy Cha, RN 02/06/2018, 12:10 PM

## 2018-02-20 ENCOUNTER — Telehealth: Payer: Self-pay | Admitting: Hematology

## 2018-02-20 ENCOUNTER — Inpatient Hospital Stay: Payer: BLUE CROSS/BLUE SHIELD

## 2018-02-20 DIAGNOSIS — D500 Iron deficiency anemia secondary to blood loss (chronic): Secondary | ICD-10-CM | POA: Diagnosis not present

## 2018-02-20 LAB — CBC WITH DIFFERENTIAL (CANCER CENTER ONLY)
Abs Immature Granulocytes: 0.01 10*3/uL (ref 0.00–0.07)
Basophils Absolute: 0.1 10*3/uL (ref 0.0–0.1)
Basophils Relative: 2 %
EOS PCT: 1 %
Eosinophils Absolute: 0.1 10*3/uL (ref 0.0–0.5)
HCT: 40.2 % (ref 36.0–46.0)
Hemoglobin: 12.7 g/dL (ref 12.0–15.0)
Immature Granulocytes: 0 %
Lymphocytes Relative: 27 %
Lymphs Abs: 1 10*3/uL (ref 0.7–4.0)
MCH: 29.7 pg (ref 26.0–34.0)
MCHC: 31.6 g/dL (ref 30.0–36.0)
MCV: 94.1 fL (ref 80.0–100.0)
Monocytes Absolute: 0.3 10*3/uL (ref 0.1–1.0)
Monocytes Relative: 8 %
Neutro Abs: 2.4 10*3/uL (ref 1.7–7.7)
Neutrophils Relative %: 62 %
Platelet Count: 492 10*3/uL — ABNORMAL HIGH (ref 150–400)
RBC: 4.27 MIL/uL (ref 3.87–5.11)
RDW: 14.1 % (ref 11.5–15.5)
WBC Count: 3.9 10*3/uL — ABNORMAL LOW (ref 4.0–10.5)
nRBC: 0 % (ref 0.0–0.2)

## 2018-02-20 LAB — IRON AND TIBC
Iron: 32 ug/dL — ABNORMAL LOW (ref 41–142)
SATURATION RATIOS: 14 % — AB (ref 21–57)
TIBC: 229 ug/dL — ABNORMAL LOW (ref 236–444)
UIBC: 197 ug/dL (ref 120–384)

## 2018-02-20 LAB — FERRITIN: FERRITIN: 46 ng/mL (ref 11–307)

## 2018-02-20 NOTE — Telephone Encounter (Signed)
Patient came and got a print out of her appointments.

## 2018-03-21 ENCOUNTER — Ambulatory Visit
Admission: RE | Admit: 2018-03-21 | Discharge: 2018-03-21 | Disposition: A | Discharge status: Home / Self Care | Payer: 59 | Source: Ambulatory Visit | Attending: Radiology | Admitting: Radiology

## 2018-03-21 ENCOUNTER — Encounter: Payer: Self-pay | Admitting: *Deleted

## 2018-03-21 DIAGNOSIS — D259 Leiomyoma of uterus, unspecified: Secondary | ICD-10-CM

## 2018-03-21 HISTORY — PX: IR RADIOLOGIST EVAL & MGMT: IMG5224

## 2018-03-21 NOTE — Progress Notes (Signed)
Chief Complaint: Patient was seen in consultation today for  Chief Complaint  Patient presents with  . Follow-up    6 week follow up [place]     at the request of Allred,Darrell K  Referring Physician(s): Kendall Flack  History of Present Illness: Sierra Mejia is a 38 y.o. female with history of fibroids, anemia and menorrhagia.  History of iron transfusions and started Lupron prior to her uterine artery embolization.  Uterine artery embolization procedure was performed on 02/05/2018.  The post procedure course was unremarkable.  Pain and cramping resolved approximately 5 days after the procedure.  Patient is back to work and back to normal activities.  She has not had a menstrual period due to the Lupron.  She feels that her urinary frequency has decreased and she has less heaviness in the pelvis.  She denies fevers, chills or vaginal discharge.  Past Medical History:  Diagnosis Date  . Anemia   . Fibroids   . Wears contact lenses     Past Surgical History:  Procedure Laterality Date  . IR ANGIOGRAM PELVIS SELECTIVE OR SUPRASELECTIVE  02/05/2018  . IR ANGIOGRAM PELVIS SELECTIVE OR SUPRASELECTIVE  02/05/2018  . IR ANGIOGRAM SELECTIVE EACH ADDITIONAL VESSEL  02/05/2018  . IR ANGIOGRAM SELECTIVE EACH ADDITIONAL VESSEL  02/05/2018  . IR EMBO TUMOR ORGAN ISCHEMIA INFARCT INC GUIDE ROADMAPPING  02/05/2018  . IR RADIOLOGIST EVAL & MGMT  12/14/2017  . IR US GUIDE VASC ACCESS RIGHT  02/05/2018  . MYOMECTOMY    . ORIF ANKLE FRACTURE Left 02/25/2015   Procedure: OPEN REDUCTION INTERNAL FIXATION (ORIF) ANKLE FRACTURE LEFT FIBULAR;  Surgeon: Samara Deist, DPM;  Location: [place];  Service: Podiatry;  Laterality: Left;  WITH POPLITEAL    Allergies: Patient has no known allergies.  Medications: Prior to Admission medications   Medication Sig Start Date End Date Taking? Authorizing Provider  Ferrous Sulfate (IRON) 325 (65 Fe) MG TABS TAKE 1 TABLET BY MOUTH THREE TIMES  DAILY WITH MEALS 11/30/17  Yes Anyanwu, Ugonna A, MD  leuprolide (LUPRON DEPOT, 84-MONTH,) 11.25 MG injection Inject 1 kit into the muscle every 3 (three) months.   Yes [provider]     Family History  Problem Relation Age of Onset  . Hypertension Father   . Cancer Neg Hx     Social History   Socioeconomic History  . Marital status: Single    Spouse name: Not on file  . Number of children: 0  . Years of education: Not on file  . Highest education level: Not on file  Occupational History  . Not on file  Social Needs  . Financial resource strain: Not on file  . Food insecurity:    Worry: Not on file    Inability: Not on file  . Transportation needs:    Medical: Not on file    Non-medical: Not on file  Tobacco Use  . Smoking status: Never Smoker  . Smokeless tobacco: Never Used  Substance and Sexual Activity  . Alcohol use: Yes    Comment: social   . Drug use: Not Currently  . Sexual activity: Not on file  Lifestyle  . Physical activity:    Days per week: Not on file    Minutes per session: Not on file  . Stress: Not on file  Relationships  . Social connections:    Talks on phone: Not on file    Gets together: Not on file    Attends religious  service: Not on file    Active member of club or organization: Not on file    Attends meetings of clubs or organizations: Not on file    Relationship status: Not on file  Other Topics Concern  . Not on file  Social History Narrative  . Not on file     Review of Systems  Constitutional: Negative.  Negative for chills and fever.  Genitourinary: Positive for frequency and vaginal bleeding. Negative for pelvic pain.    Vital Signs: BP (!) 141/93   Pulse 82   Temp 98.3 F (36.8 C) (Oral)   Resp 14   Ht 5' 9.5" (1.765 m)   Wt 112.6 kg   LMP 12/22/2017 (Approximate)   SpO2 100%   BMI 36.13 kg/m   Physical Exam Vitals signs reviewed.  Constitutional:      General: She is not in acute  distress. Cardiovascular:     Rate and Rhythm: Normal rate and regular rhythm.  Pulmonary:     Effort: Pulmonary effort is normal.     Breath sounds: Normal breath sounds.  Abdominal:     General: Bowel sounds are normal.     Palpations: Abdomen is soft.     Comments: Fullness in the anterior pelvis related to the enlarged uterus.  Patient is nontender with palpation of this area.  Right groin access site appears normal.  Strong right common femoral artery pulse without hematoma or discoloration.       Imaging: No results found.  Labs:  CBC: Recent Labs    12/26/17 1211 01/23/18 1151 02/05/18 0902 02/20/18 1126  WBC 3.1* 2.6* 2.8* 3.9*  HGB 9.5* 10.0* 10.5* 12.7  HCT 31.1* 31.5* 34.4* 40.2  PLT 410* 330 340 492*    COAGS: Recent Labs    02/05/18 0902  INR 1.03    BMP: Recent Labs    11/29/17 0352 02/05/18 0902  NA 144 139  K 3.9 4.0  CL 111 108  CO2 25 23  GLUCOSE 79 79  BUN 12 12  CALCIUM 8.8* 8.4*  CREATININE 0.67 0.65  GFRNONAA >60 >60  GFRAA >60 >60    LIVER FUNCTION TESTS: Recent Labs    11/29/17 0352  BILITOT 0.7  AST 20  ALT 16  ALKPHOS 56  PROT 6.3*  ALBUMIN 3.5    TUMOR MARKERS: No results for input(s): AFPTM, CEA, CA199, CHROMGRNA in the last 8760 hours.  Assessment and Plan:  38 year old with uterine fibroids and menorrhagia.  Patient underwent uterine artery embolization procedure on 02/05/2018.  Post procedure course has been uncomplicated.  Patient has not had a menstrual period since the procedure due to Lupron.  Patient will contact us if she has any problems in the upcoming months.  Otherwise, we will see the patient back in approximately 6 months for follow-up visit and follow-up pelvic MRI.  Thank you for this interesting consult.  I greatly enjoyed meeting Sierra Mejia and look forward to participating in their care.  A copy of this report was sent to the requesting provider on this date.  Electronically  Signed: Burman Riis 03/21/2018, 1:25 PM   I spent a total of    10 Minutes in face to face in clinical consultation, greater than 50% of which was counseling/coordinating care for uterine fibroids.   Patient ID: Sierra Mejia, female   DOB: 08/21/1980, 38 y.o.   MRN: [id number]

## 2018-04-18 NOTE — Progress Notes (Signed)
[place]   [phone] Fax:([phone]) [phone]   Clinic Follow up Note   Patient Care Team: Eldred Manges, MD as PCP - General (Obstetrics and Gynecology)  Date of Service:  04/20/2018  CHIEF COMPLAINT: F/u of anemia    CURRENT THERAPY:  IV Feraheme as needed   INTERVAL HISTORY:  Sierra Mejia is here for a follow up of anemia. She presents to the clinic today by herself. She notes she had endometrial embolization in 01/2018.  Her last Lupron injection was a 3 month injection given in 12/2017. She has not had a period since it. She notes prior to procedure and injection she would bleed for months. Her gynecologist retired so she will look for another. She does not currently have a PCP but is willing to find one.  She notes she has not had a mammogram before and denies family history of breast cancer.   REVIEW OF SYSTEMS:   Constitutional: Denies fevers, chills or abnormal weight loss Eyes: Denies blurriness of vision Ears, nose, mouth, throat, and face: Denies mucositis or sore throat Respiratory: Denies cough, dyspnea or wheezes Cardiovascular: Denies palpitation, chest discomfort or lower extremity swelling Gastrointestinal:  Denies nausea, heartburn or change in bowel habits Skin: Denies abnormal skin rashes Lymphatics: Denies new lymphadenopathy or easy bruising Neurological:Denies numbness, tingling or new weaknesses Behavioral/Psych: Mood is stable, no new changes  All other systems were reviewed with the patient and are negative.  MEDICAL HISTORY:  Past Medical History:  Diagnosis Date  . Anemia   . Fibroids   . Wears contact lenses     SURGICAL HISTORY: Past Surgical History:  Procedure Laterality Date  . IR ANGIOGRAM PELVIS SELECTIVE OR SUPRASELECTIVE  02/05/2018  . IR ANGIOGRAM PELVIS SELECTIVE OR SUPRASELECTIVE  02/05/2018  . IR ANGIOGRAM SELECTIVE EACH ADDITIONAL VESSEL  02/05/2018  . IR ANGIOGRAM SELECTIVE EACH ADDITIONAL  VESSEL  02/05/2018  . IR EMBO TUMOR ORGAN ISCHEMIA INFARCT INC GUIDE ROADMAPPING  02/05/2018  . IR RADIOLOGIST EVAL & MGMT  12/14/2017  . IR RADIOLOGIST EVAL & MGMT  03/21/2018  . IR US GUIDE VASC ACCESS RIGHT  02/05/2018  . MYOMECTOMY    . ORIF ANKLE FRACTURE Left 02/25/2015   Procedure: OPEN REDUCTION INTERNAL FIXATION (ORIF) ANKLE FRACTURE LEFT FIBULAR;  Surgeon: Samara Deist, DPM;  Location: [place];  Service: Podiatry;  Laterality: Left;  WITH POPLITEAL    I have reviewed the social history and family history with the patient and they are unchanged from previous note.  ALLERGIES:  has No Known Allergies.  MEDICATIONS:  Current Outpatient Medications  Medication Sig Dispense Refill  . Ferrous Sulfate (IRON) 325 (65 Fe) MG TABS TAKE 1 TABLET BY MOUTH THREE TIMES DAILY WITH MEALS 90 tablet 3   No current facility-administered medications for this visit.     PHYSICAL EXAMINATION: ECOG PERFORMANCE STATUS: 0 - Asymptomatic  Vitals:   04/20/18 1258  BP: 128/82  Pulse: 74  Resp: 17  Temp: 98.6 F (37 C)  SpO2: 100%   Filed Weights   04/20/18 1258  Weight: 249 lb 4.8 oz (113.1 kg)    GENERAL:alert, no distress and comfortable SKIN: skin color, texture, turgor are normal, no rashes or significant lesions EYES: normal, Conjunctiva are pink and non-injected, sclera clear OROPHARYNX:no exudate, no erythema and lips, buccal mucosa, and tongue normal  NECK: supple, thyroid normal size, non-tender, without nodularity LYMPH:  no palpable lymphadenopathy in the cervical, axillary or inguinal LUNGS: clear to auscultation  and percussion with normal breathing effort HEART: regular rate & rhythm and no murmurs and no lower extremity edema ABDOMEN:abdomen soft, non-tender and normal bowel sounds Musculoskeletal:no cyanosis of digits and no clubbing  NEURO: alert & oriented x 3 with fluent speech, no focal motor/sensory deficits  LABORATORY DATA:  I have reviewed the data  as listed CBC Latest Ref Rng & Units 04/20/2018 02/20/2018 02/05/2018  WBC 4.0 - 10.5 K/uL 3.4(L) 3.9(L) 2.8(L)  Hemoglobin 12.0 - 15.0 g/dL 11.7(L) 12.7 10.5(L)  Hematocrit 36.0 - 46.0 % 37.0 40.2 34.4(L)  Platelets 150 - 400 K/uL 291 492(H) 340     CMP Latest Ref Rng & Units 02/05/2018 11/29/2017  Glucose 70 - 99 mg/dL 79 79  BUN 6 - 20 mg/dL 12 12  Creatinine 0.44 - 1.00 mg/dL 0.65 0.67  Sodium 135 - 145 mmol/L 139 144  Potassium 3.5 - 5.1 mmol/L 4.0 3.9  Chloride 98 - 111 mmol/L 108 111  CO2 22 - 32 mmol/L 23 25  Calcium 8.9 - 10.3 mg/dL 8.4(L) 8.8(L)  Total Protein 6.5 - 8.1 g/dL - 6.3(L)  Total Bilirubin 0.3 - 1.2 mg/dL - 0.7  Alkaline Phos 38 - 126 U/L - 56  AST 15 - 41 U/L - 20  ALT 0 - 44 U/L - 16      RADIOGRAPHIC STUDIES: I have personally reviewed the radiological images as listed and agreed with the findings in the report. No results found.   ASSESSMENT & PLAN:  Sierra Mejia is a 38 y.o. female with   1.Iron deficient anemia, secondary to menorrhagia -She haschronic anemia and iron deficiency, previously required blood transfusion once and IVirononce about 1 year ago.  -She has menorrhagia from fibroid. This is likely the cause of her IDA.She had a 3 months Lupron injection in 12/2017 to stop bleeding before her endometrial ablation in 01/2018. She has not had a period since then. -She is in oral iron  -Labs reviewed, CBC WNL except WBC at 3.4, 11.7. Iron panel is pending.  -Her previous iron study showed decreased TIBC, suggesting component of anemia of chronic disease -Continue oral iron daily and IV iron as needed.  -continue monitoring with labs every 3 months, f/u in 9 months.  -I encouraged her to find a PCP and Gyn to follow up with in interim.   2. Mild Leukopenia -With mild intermittent neutropenia.  We did consider the possibility of immune related neutropenia, or mild leukopenia secondary to her ethnicity -I discussed has been chronically  low since 2019. Given this is mild and stable. I recommend continuing observation, no indication of treatment at this time.    3. Cancer Screening  -She is 38 y.o. now. I will order baseline Mammogram for her. She does not have a family history of breast cancer. Continue screening yearly.     PLAN: Lab reviewed, will cal her next week about her iron study result Lab in 3, 6 and 9 months  F/u in 9 month  Will order a screening mammogram for her    No problem-specific Assessment & Plan notes found for this encounter.   Orders Placed This Encounter  Procedures  . MM Digital Screening    Standing Status:   Future    Standing Expiration Date:   06/19/2019    Order Specific Question:   Reason for Exam (SYMPTOM  OR DIAGNOSIS REQUIRED)    Answer:   screening    Order Specific Question:   Is the patient pregnant?  Answer:   No    Order Specific Question:   Preferred imaging location?    Answer:   [facility]   All questions were answered. The patient knows to call the clinic with any problems, questions or concerns. No barriers to learning was detected. I spent 15 minutes counseling the patient face to face. The total time spent in the appointment was 20 minutes and more than 50% was on counseling and review of test results     Truitt Merle, MD 04/20/2018   I, Joslyn Devon, am acting as scribe for Truitt Merle, MD.   I have reviewed the above documentation for accuracy and completeness, and I agree with the above.

## 2018-04-20 ENCOUNTER — Inpatient Hospital Stay: Payer: 59 | Attending: Hematology

## 2018-04-20 ENCOUNTER — Telehealth: Payer: Self-pay | Admitting: Hematology

## 2018-04-20 ENCOUNTER — Encounter: Payer: Self-pay | Admitting: Hematology

## 2018-04-20 ENCOUNTER — Inpatient Hospital Stay (HOSPITAL_BASED_OUTPATIENT_CLINIC_OR_DEPARTMENT_OTHER): Payer: 59 | Admitting: Hematology

## 2018-04-20 VITALS — BP 128/82 | HR 74 | Temp 98.6°F | Resp 17 | Ht 69.5 in | Wt 249.3 lb

## 2018-04-20 DIAGNOSIS — D72819 Decreased white blood cell count, unspecified: Secondary | ICD-10-CM | POA: Insufficient documentation

## 2018-04-20 DIAGNOSIS — D500 Iron deficiency anemia secondary to blood loss (chronic): Secondary | ICD-10-CM | POA: Diagnosis present

## 2018-04-20 DIAGNOSIS — Z1231 Encounter for screening mammogram for malignant neoplasm of breast: Secondary | ICD-10-CM

## 2018-04-20 DIAGNOSIS — N920 Excessive and frequent menstruation with regular cycle: Secondary | ICD-10-CM

## 2018-04-20 LAB — CBC WITH DIFFERENTIAL (CANCER CENTER ONLY)
Abs Immature Granulocytes: 0 10*3/uL (ref 0.00–0.07)
Basophils Absolute: 0 10*3/uL (ref 0.0–0.1)
Basophils Relative: 1 %
Eosinophils Absolute: 0 10*3/uL (ref 0.0–0.5)
Eosinophils Relative: 1 %
HCT: 37 % (ref 36.0–46.0)
Hemoglobin: 11.7 g/dL — ABNORMAL LOW (ref 12.0–15.0)
IMMATURE GRANULOCYTES: 0 %
Lymphocytes Relative: 30 %
Lymphs Abs: 1 10*3/uL (ref 0.7–4.0)
MCH: 28.7 pg (ref 26.0–34.0)
MCHC: 31.6 g/dL (ref 30.0–36.0)
MCV: 90.9 fL (ref 80.0–100.0)
Monocytes Absolute: 0.2 10*3/uL (ref 0.1–1.0)
Monocytes Relative: 6 %
NEUTROS PCT: 62 %
Neutro Abs: 2.2 10*3/uL (ref 1.7–7.7)
Platelet Count: 291 10*3/uL (ref 150–400)
RBC: 4.07 MIL/uL (ref 3.87–5.11)
RDW: 15.8 % — ABNORMAL HIGH (ref 11.5–15.5)
WBC Count: 3.4 10*3/uL — ABNORMAL LOW (ref 4.0–10.5)
nRBC: 0 % (ref 0.0–0.2)

## 2018-04-20 LAB — IRON AND TIBC
Iron: 64 ug/dL (ref 41–142)
SATURATION RATIOS: 25 % (ref 21–57)
TIBC: 258 ug/dL (ref 236–444)
UIBC: 193 ug/dL (ref 120–384)

## 2018-04-20 LAB — FERRITIN: Ferritin: 27 ng/mL (ref 11–307)

## 2018-04-20 NOTE — Telephone Encounter (Signed)
Gave avs and calendar ° °

## 2018-07-17 ENCOUNTER — Inpatient Hospital Stay: Payer: 59 | Attending: Obstetrics and Gynecology

## 2018-07-23 ENCOUNTER — Ambulatory Visit: Payer: Self-pay

## 2018-08-28 ENCOUNTER — Other Ambulatory Visit: Payer: Self-pay | Admitting: Obstetrics & Gynecology

## 2018-08-28 ENCOUNTER — Other Ambulatory Visit: Payer: Self-pay | Admitting: Diagnostic Radiology

## 2018-08-28 DIAGNOSIS — D259 Leiomyoma of uterus, unspecified: Secondary | ICD-10-CM

## 2018-09-18 ENCOUNTER — Ambulatory Visit
Admission: RE | Admit: 2018-09-18 | Discharge: 2018-09-18 | Disposition: A | Discharge status: Home / Self Care | Payer: 59 | Source: Ambulatory Visit | Attending: Obstetrics and Gynecology | Admitting: Obstetrics and Gynecology

## 2018-09-18 ENCOUNTER — Other Ambulatory Visit: Payer: Self-pay

## 2018-09-18 DIAGNOSIS — D259 Leiomyoma of uterus, unspecified: Secondary | ICD-10-CM

## 2018-09-18 MED ORDER — GADOBENATE DIMEGLUMINE 529 MG/ML IV SOLN
20.0000 mL | Freq: Once | INTRAVENOUS | Status: AC | PRN
  Administered 2018-09-18: 18:00:00 20 mL via INTRAVENOUS

## 2018-09-19 ENCOUNTER — Encounter: Payer: Self-pay | Admitting: *Deleted

## 2018-09-19 ENCOUNTER — Other Ambulatory Visit: Payer: Self-pay

## 2018-09-19 ENCOUNTER — Ambulatory Visit
Admission: RE | Admit: 2018-09-19 | Discharge: 2018-09-19 | Disposition: A | Discharge status: Home / Self Care | Payer: 59 | Source: Ambulatory Visit | Attending: Diagnostic Radiology | Admitting: Diagnostic Radiology

## 2018-09-19 DIAGNOSIS — D259 Leiomyoma of uterus, unspecified: Secondary | ICD-10-CM

## 2018-09-19 HISTORY — PX: IR RADIOLOGIST EVAL & MGMT: IMG5224

## 2018-09-19 NOTE — Progress Notes (Signed)
Chief Complaint: Patient was consulted remotely today (TeleHealth) for uterine fibroid follow-up at the request of Madilynn Montante.    Referring Physician(s): Haygood, Lorriane Shire; Truitt Merle  History of Present Illness: Sierra Mejia is a 38 y.o. female with uterine fibroids, menorrhagia and anemia.  History of myomectomy procedure in 2014.  Patient had a uterine artery embolization procedure on 02/05/2018.  Post procedure course was unremarkable.  Overall, she is very happy with the results of the embolization procedure.  Her menstrual bleeding is much lighter.  The periods are regular and last approximately 5 days.  She continues to have mild cramping associated with her menstrual periods but this is not causing significant symptoms.  She denies vaginal discharge.  Patient had a MRI of the pelvis yesterday.  Past Medical History:  Diagnosis Date  . Anemia   . Fibroids   . Wears contact lenses     Past Surgical History:  Procedure Laterality Date  . IR ANGIOGRAM PELVIS SELECTIVE OR SUPRASELECTIVE  02/05/2018  . IR ANGIOGRAM PELVIS SELECTIVE OR SUPRASELECTIVE  02/05/2018  . IR ANGIOGRAM SELECTIVE EACH ADDITIONAL VESSEL  02/05/2018  . IR ANGIOGRAM SELECTIVE EACH ADDITIONAL VESSEL  02/05/2018  . IR EMBO TUMOR ORGAN ISCHEMIA INFARCT INC GUIDE ROADMAPPING  02/05/2018  . IR RADIOLOGIST EVAL & MGMT  12/14/2017  . IR RADIOLOGIST EVAL & MGMT  03/21/2018  . IR RADIOLOGIST EVAL & MGMT  09/19/2018  . IR US GUIDE VASC ACCESS RIGHT  02/05/2018  . MYOMECTOMY    . ORIF ANKLE FRACTURE Left 02/25/2015   Procedure: OPEN REDUCTION INTERNAL FIXATION (ORIF) ANKLE FRACTURE LEFT FIBULAR;  Surgeon: Samara Deist, DPM;  Location: [place];  Service: Podiatry;  Laterality: Left;  WITH POPLITEAL    Allergies: Patient has no known allergies.  Medications: Prior to Admission medications   Medication Sig Start Date End Date Taking? Authorizing Provider  Ferrous Sulfate (IRON) 325 (65 Fe) MG TABS TAKE  1 TABLET BY MOUTH THREE TIMES DAILY WITH MEALS 11/30/17   Anyanwu, Sallyanne Havers, MD     Family History  Problem Relation Age of Onset  . Hypertension Father   . Cancer Neg Hx     Social History   Socioeconomic History  . Marital status: Single    Spouse name: Not on file  . Number of children: 0  . Years of education: Not on file  . Highest education level: Not on file  Occupational History  . Not on file  Social Needs  . Financial resource strain: Not on file  . Food insecurity    Worry: Not on file    Inability: Not on file  . Transportation needs    Medical: Not on file    Non-medical: Not on file  Tobacco Use  . Smoking status: Never Smoker  . Smokeless tobacco: Never Used  Substance and Sexual Activity  . Alcohol use: Yes    Comment: social   . Drug use: Not Currently  . Sexual activity: Not on file  Lifestyle  . Physical activity    Days per week: Not on file    Minutes per session: Not on file  . Stress: Not on file  Relationships  . Social Herbalist on phone: Not on file    Gets together: Not on file    Attends religious service: Not on file    Active member of club or organization: Not on file    Attends meetings of clubs or organizations: Not  on file    Relationship status: Not on file  Other Topics Concern  . Not on file  Social History Narrative  . Not on file      Review of Systems  Genitourinary: Positive for menstrual problem.    Physical Exam No direct physical exam was performed (except for noted visual exam findings with Video Visits).   Vital Signs: There were no vitals taken for this visit.  Imaging: Ir Radiologist Eval & Mgmt  Result Date: 09/19/2018 Please refer to notes tab for details about interventional procedure. (Op Note)   Labs:  CBC: Recent Labs    01/23/18 1151 02/05/18 0902 02/20/18 1126 04/20/18 1225  WBC 2.6* 2.8* 3.9* 3.4*  HGB 10.0* 10.5* 12.7 11.7*  HCT 31.5* 34.4* 40.2 37.0  PLT 330 340  492* 291    COAGS: Recent Labs    02/05/18 0902  INR 1.03    BMP: Recent Labs    11/29/17 0352 02/05/18 0902  NA 144 139  K 3.9 4.0  CL 111 108  CO2 25 23  GLUCOSE 79 79  BUN 12 12  CALCIUM 8.8* 8.4*  CREATININE 0.67 0.65  GFRNONAA >60 >60  GFRAA >60 >60    LIVER FUNCTION TESTS: Recent Labs    11/29/17 0352  BILITOT 0.7  AST 20  ALT 16  ALKPHOS 56  PROT 6.3*  ALBUMIN 3.5    TUMOR MARKERS: No results for input(s): AFPTM, CEA, CA199, CHROMGRNA in the last 8760 hours.  Assessment and Plan:  38 year old female with uterine fibroids and history of menorrhagia and anemia.  Uterine artery embolization was performed on 02/05/2018 and patient has done very well following the procedure.  Overall, she is very happy with the results of the procedure with lighter menstrual bleeding.  Patient had an MRI of the pelvis yesterday.  There is not a official report yet but I did review the imaging.  The uterus has slightly decreased in size and there is no significant enhancement within the multiple uterine fibroids.  Many of the fibroids are slightly decreased in size.  The patient continues to have a very large uterus, measuring greater than 17 cm.  Explained to the patient that she is at risk for developing new fibroids based on her age but only time will tell.  At this point, the patient is doing well following uterine artery embolization procedure and can follow-up with interventional radiology as needed.    Electronically Signed: Burman Riis 09/19/2018, 12:26 PM   I spent a total of    10 Minutes in remote  clinical consultation, greater than 50% of which was counseling/coordinating care for uterine fibroids and menorrhagia.    Visit type: Audio only (telephone). Audio (no video) only due to patient preference. Alternative for in-person consultation at [facility], [address], [place], [place]. This visit type was conducted due to national recommendations for  restrictions regarding the COVID-19 Pandemic (e.g. social distancing).  This format is felt to be most appropriate for this patient at this time.  All issues noted in this document were discussed and addressed.  Patient ID: Sierra Mejia, female   DOB: Dec 22, 1980, 38 y.o.   MRN: [id number]

## 2018-10-16 ENCOUNTER — Other Ambulatory Visit: Payer: Self-pay

## 2018-10-16 ENCOUNTER — Inpatient Hospital Stay: Payer: 59 | Attending: Hematology

## 2018-10-16 DIAGNOSIS — N920 Excessive and frequent menstruation with regular cycle: Secondary | ICD-10-CM | POA: Diagnosis present

## 2018-10-16 DIAGNOSIS — D500 Iron deficiency anemia secondary to blood loss (chronic): Secondary | ICD-10-CM | POA: Diagnosis present

## 2018-10-16 LAB — CBC WITH DIFFERENTIAL (CANCER CENTER ONLY)
Abs Immature Granulocytes: 0.01 10*3/uL (ref 0.00–0.07)
Basophils Absolute: 0.1 10*3/uL (ref 0.0–0.1)
Basophils Relative: 2 %
Eosinophils Absolute: 0 10*3/uL (ref 0.0–0.5)
Eosinophils Relative: 1 %
HCT: 33.1 % — ABNORMAL LOW (ref 36.0–46.0)
Hemoglobin: 10.7 g/dL — ABNORMAL LOW (ref 12.0–15.0)
Immature Granulocytes: 0 %
Lymphocytes Relative: 38 %
Lymphs Abs: 1.1 10*3/uL (ref 0.7–4.0)
MCH: 30.7 pg (ref 26.0–34.0)
MCHC: 32.3 g/dL (ref 30.0–36.0)
MCV: 95.1 fL (ref 80.0–100.0)
Monocytes Absolute: 0.3 10*3/uL (ref 0.1–1.0)
Monocytes Relative: 10 %
Neutro Abs: 1.5 10*3/uL — ABNORMAL LOW (ref 1.7–7.7)
Neutrophils Relative %: 49 %
Platelet Count: 307 10*3/uL (ref 150–400)
RBC: 3.48 MIL/uL — ABNORMAL LOW (ref 3.87–5.11)
RDW: 14.3 % (ref 11.5–15.5)
WBC Count: 3 10*3/uL — ABNORMAL LOW (ref 4.0–10.5)
nRBC: 0 % (ref 0.0–0.2)

## 2018-10-16 LAB — IRON AND TIBC
Iron: 27 ug/dL — ABNORMAL LOW (ref 41–142)
Saturation Ratios: 10 % — ABNORMAL LOW (ref 21–57)
TIBC: 266 ug/dL (ref 236–444)
UIBC: 239 ug/dL (ref 120–384)

## 2018-10-16 LAB — FERRITIN: Ferritin: 6 ng/mL — ABNORMAL LOW (ref 11–307)

## 2018-10-18 ENCOUNTER — Telehealth: Payer: Self-pay

## 2018-10-18 NOTE — Telephone Encounter (Signed)
TC to Pt informed Pt. Of lab results and Lacie's recommendation for Feraheme x2  Pt. Was reluctant to do Feraheme treatments stated she had iron treatments before. Pt. Stated that she is not  Compliant with taking iron and stated she would rather start taking iron and before considering Feraheme treatment. Pt stated she would do this till her next appointment and will decide about Feraheme then. Pt informed if she should have any problems to give Lacie a call. Pt. Verbalized understanding no further problems or concerns noted

## 2018-10-18 NOTE — Telephone Encounter (Signed)
-----   Message from Alla Feeling, NP sent at 10/16/2018  4:21 PM EDT ----- Please let her know her iron is low and she wound benefit from IV feraheme weekly x2, first available. Please send schedule message if she agrees.  Thanks, Regan Rakers

## 2018-12-17 ENCOUNTER — Telehealth: Payer: Self-pay | Admitting: Hematology

## 2018-12-17 NOTE — Telephone Encounter (Signed)
YF PAL 11/25. Moved appointment from 11/25 to 12/3. Left message. Schedule mailed.

## 2019-01-16 ENCOUNTER — Other Ambulatory Visit: Payer: Self-pay

## 2019-01-16 ENCOUNTER — Ambulatory Visit: Payer: 59 | Admitting: Hematology

## 2019-01-22 ENCOUNTER — Telehealth: Payer: Self-pay | Admitting: Hematology

## 2019-01-22 NOTE — Telephone Encounter (Signed)
Returned patient's phone call regarding rescheduling 12/03 appointment, per patient's request appointment has moved to 12/15.

## 2019-01-24 ENCOUNTER — Inpatient Hospital Stay: Payer: 59

## 2019-01-24 ENCOUNTER — Inpatient Hospital Stay: Payer: 59 | Admitting: Hematology

## 2019-02-01 ENCOUNTER — Telehealth: Payer: Self-pay | Admitting: Hematology

## 2019-02-01 NOTE — Telephone Encounter (Signed)
Returned patient' phone call regarding rescheduling an appointment, left a voicemail.

## 2019-02-01 NOTE — Telephone Encounter (Signed)
Returned patient's phone call regarding rescheduling 12/15 appointment, per patient's request appointment has moved to 12/17.

## 2019-02-04 ENCOUNTER — Other Ambulatory Visit: Payer: Self-pay

## 2019-02-04 DIAGNOSIS — D500 Iron deficiency anemia secondary to blood loss (chronic): Secondary | ICD-10-CM

## 2019-02-05 ENCOUNTER — Inpatient Hospital Stay: Payer: 59 | Admitting: Hematology

## 2019-02-05 ENCOUNTER — Inpatient Hospital Stay: Payer: 59

## 2019-02-05 NOTE — Progress Notes (Signed)
[place]   Telephone:(336) [phone] Fax:(336) [phone]   Clinic Follow up Note   Patient Care Team: Leo Grosser Seymour Bars, MD (Inactive) as PCP - General (Obstetrics and Gynecology)  Date of Service:  02/07/2019  CHIEF COMPLAINT: F/u of anemia    CURRENT THERAPY:  IV Feraheme as needed, oral iron (not compliant)   INTERVAL HISTORY:  Sierra Mejia is here for a follow up of anemia. She presents to the clinic alone.  She has been doing well overall, no worsening fatigue lately.  She tolerates normal activities without difficulty.  No dyspnea or chest pain.  Since her uterine embolization 1 year ago, her menstrual.  Has been lighter, but she has spotting in between.  She denies any other signs of bleeding.  ROS otherwise negative.   All other systems were reviewed with the patient and are negative.  MEDICAL HISTORY:  Past Medical History:  Diagnosis Date  . Anemia   . Fibroids   . Wears contact lenses     SURGICAL HISTORY: Past Surgical History:  Procedure Laterality Date  . IR ANGIOGRAM PELVIS SELECTIVE OR SUPRASELECTIVE  02/05/2018  . IR ANGIOGRAM PELVIS SELECTIVE OR SUPRASELECTIVE  02/05/2018  . IR ANGIOGRAM SELECTIVE EACH ADDITIONAL VESSEL  02/05/2018  . IR ANGIOGRAM SELECTIVE EACH ADDITIONAL VESSEL  02/05/2018  . IR EMBO TUMOR ORGAN ISCHEMIA INFARCT INC GUIDE ROADMAPPING  02/05/2018  . IR RADIOLOGIST EVAL & MGMT  12/14/2017  . IR RADIOLOGIST EVAL & MGMT  03/21/2018  . IR RADIOLOGIST EVAL & MGMT  09/19/2018  . IR US GUIDE VASC ACCESS RIGHT  02/05/2018  . MYOMECTOMY    . ORIF ANKLE FRACTURE Left 02/25/2015   Procedure: OPEN REDUCTION INTERNAL FIXATION (ORIF) ANKLE FRACTURE LEFT FIBULAR;  Surgeon: Samara Deist, DPM;  Location: [place];  Service: Podiatry;  Laterality: Left;  WITH POPLITEAL    I have reviewed the social history and family history with the patient and they are unchanged from previous note.  ALLERGIES:  has No Known Allergies.   MEDICATIONS:  Current Outpatient Medications  Medication Sig Dispense Refill  . Ferrous Sulfate (IRON) 325 (65 Fe) MG TABS Take 1 tablet (325 mg total) by mouth 3 (three) times daily with meals. 90 tablet 3   No current facility-administered medications for this visit.    PHYSICAL EXAMINATION: ECOG PERFORMANCE STATUS: 0 - Asymptomatic  Vitals:   02/07/19 0813  BP: 123/82  Pulse: 67  Resp: 18  Temp: 97.9 F (36.6 C)  SpO2: 100%   Filed Weights   02/07/19 0813  Weight: 226 lb (102.5 kg)   GENERAL:alert, no distress and comfortable SKIN: skin color, texture, turgor are normal, no rashes or significant lesions LUNGS: clear to auscultation and percussion with normal breathing effort HEART: regular rate & rhythm and no murmurs and no lower extremity edema ABDOMEN:abdomen soft, non-tender and normal bowel sounds Musculoskeletal:no cyanosis of digits and no clubbing  NEURO: alert & oriented x 3 with fluent speech, no focal motor/sensory deficits  LABORATORY DATA:  I have reviewed the data as listed CBC Latest Ref Rng & Units 02/07/2019 10/16/2018 04/20/2018  WBC 4.0 - 10.5 K/uL 2.6(L) 3.0(L) 3.4(L)  Hemoglobin 12.0 - 15.0 g/dL 8.8(L) 10.7(L) 11.7(L)  Hematocrit 36.0 - 46.0 % 28.1(L) 33.1(L) 37.0  Platelets 150 - 400 K/uL 320 307 291     CMP Latest Ref Rng & Units 02/05/2018 11/29/2017  Glucose 70 - 99 mg/dL 79 79  BUN 6 - 20 mg/dL 12 12  Creatinine 0.44 - 1.00 mg/dL 0.65 0.67  Sodium 135 - 145 mmol/L 139 144  Potassium 3.5 - 5.1 mmol/L 4.0 3.9  Chloride 98 - 111 mmol/L 108 111  CO2 22 - 32 mmol/L 23 25  Calcium 8.9 - 10.3 mg/dL 8.4(L) 8.8(L)  Total Protein 6.5 - 8.1 g/dL - 6.3(L)  Total Bilirubin 0.3 - 1.2 mg/dL - 0.7  Alkaline Phos 38 - 126 U/L - 56  AST 15 - 41 U/L - 20  ALT 0 - 44 U/L - 16      RADIOGRAPHIC STUDIES: I have personally reviewed the radiological images as listed and agreed with the findings in the report. No results found.   ASSESSMENT & PLAN:   Sierra Mejia is a 38 y.o. female with   1.Iron deficient anemia, secondary tomenorrhagia -She haschronic anemia and iron deficiency, previously required blood transfusion once and IVironin 2018 and 2019.  -She has menorrhagia from fibroid. This is likely the cause of her IDA.She had a 3 months Lupron injection in 12/2017 to stop bleeding before her endometrial ablation in 01/2018. She has had very light period since then, with some spotting -She had a negative stool OB, no other signs of bleeding.  -She will continue oral iron.  She is not very compliant -Her iron level was low 3 months ago, she declined IV iron at that time. -Lab reviewed, worsening anemia, hemoglobin 8.8, ferritin<5, she is not much symptomatic.  Due to the moderate anemia, I recommend her IV iron.  Due to her insurance coverage will change Feraheme to Venofer, weekly X4-5 -We will monitor labs every 2 months -Follow-up in 4 months   2. Mild Leukopenia -With mild intermittent neutropenia.  We did consider the possibility of immune related neutropenia, or mild leukopenia secondary to her ethnicity -Has been chronically low since 2019. Given this is mild and stable. I recommend continuing observation, no indication of treatment at this time.    3. Cancer Screening  -She is 38 y.o. now. I previously ordered baseline Mammogram for her. She does not have a family history of breast cancer. -she has not had mammogram yet     PLAN: -Lab reviewed, due to worsening moderate anemia, I recommend IV iron, she agrees. -Due to insurance coverage, will change to Venofer weekly X4 starting today -will lab in 4 week then every 2 months -f/u in 4 months    No problem-specific Assessment & Plan notes found for this encounter.   No orders of the defined types were placed in this encounter.  All questions were answered. The patient knows to call the clinic with any problems, questions or concerns. No barriers to  learning was detected. I spent 15 minutes counseling the patient face to face. The total time spent in the appointment was 25 minutes and more than 50% was on counseling and review of test results     Truitt Merle, MD 02/07/2019   I, Joslyn Devon, am acting as scribe for Truitt Merle, MD.   I have reviewed the above documentation for accuracy and completeness, and I agree with the above.

## 2019-02-07 ENCOUNTER — Inpatient Hospital Stay: Payer: 59

## 2019-02-07 ENCOUNTER — Telehealth: Payer: Self-pay | Admitting: Hematology

## 2019-02-07 ENCOUNTER — Other Ambulatory Visit: Payer: Self-pay

## 2019-02-07 ENCOUNTER — Encounter: Payer: Self-pay | Admitting: Hematology

## 2019-02-07 ENCOUNTER — Inpatient Hospital Stay: Payer: 59 | Attending: Hematology | Admitting: Hematology

## 2019-02-07 VITALS — BP 136/91 | HR 59 | Temp 98.0°F | Resp 16

## 2019-02-07 VITALS — BP 123/82 | HR 67 | Temp 97.9°F | Resp 18 | Ht 69.5 in | Wt 226.0 lb

## 2019-02-07 DIAGNOSIS — D500 Iron deficiency anemia secondary to blood loss (chronic): Secondary | ICD-10-CM | POA: Insufficient documentation

## 2019-02-07 DIAGNOSIS — N920 Excessive and frequent menstruation with regular cycle: Secondary | ICD-10-CM | POA: Insufficient documentation

## 2019-02-07 DIAGNOSIS — D72819 Decreased white blood cell count, unspecified: Secondary | ICD-10-CM | POA: Diagnosis not present

## 2019-02-07 DIAGNOSIS — Z79899 Other long term (current) drug therapy: Secondary | ICD-10-CM | POA: Diagnosis not present

## 2019-02-07 DIAGNOSIS — D709 Neutropenia, unspecified: Secondary | ICD-10-CM | POA: Diagnosis not present

## 2019-02-07 LAB — CBC WITH DIFFERENTIAL (CANCER CENTER ONLY)
Abs Immature Granulocytes: 0 10*3/uL (ref 0.00–0.07)
Basophils Absolute: 0.1 10*3/uL (ref 0.0–0.1)
Basophils Relative: 3 %
Eosinophils Absolute: 0.1 10*3/uL (ref 0.0–0.5)
Eosinophils Relative: 2 %
HCT: 28.1 % — ABNORMAL LOW (ref 36.0–46.0)
Hemoglobin: 8.8 g/dL — ABNORMAL LOW (ref 12.0–15.0)
Immature Granulocytes: 0 %
Lymphocytes Relative: 49 %
Lymphs Abs: 1.3 10*3/uL (ref 0.7–4.0)
MCH: 27.1 pg (ref 26.0–34.0)
MCHC: 31.3 g/dL (ref 30.0–36.0)
MCV: 86.5 fL (ref 80.0–100.0)
Monocytes Absolute: 0.3 10*3/uL (ref 0.1–1.0)
Monocytes Relative: 11 %
Neutro Abs: 0.9 10*3/uL — ABNORMAL LOW (ref 1.7–7.7)
Neutrophils Relative %: 35 %
Platelet Count: 320 10*3/uL (ref 150–400)
RBC: 3.25 MIL/uL — ABNORMAL LOW (ref 3.87–5.11)
RDW: 15.2 % (ref 11.5–15.5)
WBC Count: 2.6 10*3/uL — ABNORMAL LOW (ref 4.0–10.5)
nRBC: 0 % (ref 0.0–0.2)

## 2019-02-07 LAB — IRON AND TIBC
Iron: 8 ug/dL — ABNORMAL LOW (ref 41–142)
Saturation Ratios: 2 % — ABNORMAL LOW (ref 21–57)
TIBC: 311 ug/dL (ref 236–444)
UIBC: 304 ug/dL (ref 120–384)

## 2019-02-07 LAB — FERRITIN: Ferritin: <4 ng/mL — ABNORMAL LOW (ref 11–307)

## 2019-02-07 MED ORDER — IRON 325 (65 FE) MG PO TABS: 1.0000 | ORAL_TABLET | Freq: Three times a day (TID) | ORAL | 3 refills | Status: DC

## 2019-02-07 MED ORDER — SODIUM CHLORIDE 0.9 % IV SOLN
Freq: Once | INTRAVENOUS | Status: AC
  Filled 2019-02-07: qty 250

## 2019-02-07 MED ORDER — SODIUM CHLORIDE 0.9 % IV SOLN
200.0000 mg | Freq: Once | INTRAVENOUS | Status: AC
  Administered 2019-02-07: 200 mg via INTRAVENOUS
  Filled 2019-02-07: qty 10

## 2019-02-07 NOTE — Telephone Encounter (Signed)
Scheduled appt per 12/17 los.  Printed calendar and avs. 

## 2019-02-07 NOTE — Patient Instructions (Signed)

## 2019-02-27 ENCOUNTER — Other Ambulatory Visit: Payer: Self-pay

## 2019-02-27 ENCOUNTER — Inpatient Hospital Stay: Payer: 59 | Attending: Hematology

## 2019-02-27 VITALS — BP 135/77 | HR 70 | Temp 98.1°F | Resp 16

## 2019-02-27 DIAGNOSIS — D72819 Decreased white blood cell count, unspecified: Secondary | ICD-10-CM | POA: Diagnosis not present

## 2019-02-27 DIAGNOSIS — N920 Excessive and frequent menstruation with regular cycle: Secondary | ICD-10-CM | POA: Diagnosis not present

## 2019-02-27 DIAGNOSIS — D500 Iron deficiency anemia secondary to blood loss (chronic): Secondary | ICD-10-CM | POA: Diagnosis present

## 2019-02-27 DIAGNOSIS — Z79899 Other long term (current) drug therapy: Secondary | ICD-10-CM | POA: Diagnosis not present

## 2019-02-27 MED ORDER — SODIUM CHLORIDE 0.9 % IV SOLN
Freq: Once | INTRAVENOUS | Status: AC
  Filled 2019-02-27: qty 250

## 2019-02-27 MED ORDER — SODIUM CHLORIDE 0.9 % IV SOLN
200.0000 mg | Freq: Once | INTRAVENOUS | Status: AC
  Administered 2019-02-27: 200 mg via INTRAVENOUS
  Filled 2019-02-27: qty 200

## 2019-02-27 MED ORDER — SODIUM CHLORIDE 0.9 % IV SOLN: 200.0000 mg | Freq: Once | INTRAVENOUS | Status: DC

## 2019-02-27 NOTE — Patient Instructions (Signed)

## 2019-03-06 ENCOUNTER — Other Ambulatory Visit: Payer: Self-pay | Admitting: Obstetrics & Gynecology

## 2019-03-06 ENCOUNTER — Other Ambulatory Visit: Payer: Self-pay

## 2019-03-06 ENCOUNTER — Inpatient Hospital Stay: Payer: 59

## 2019-03-06 VITALS — BP 124/92 | HR 57 | Temp 98.2°F | Resp 18

## 2019-03-06 DIAGNOSIS — D500 Iron deficiency anemia secondary to blood loss (chronic): Secondary | ICD-10-CM

## 2019-03-06 DIAGNOSIS — D259 Leiomyoma of uterus, unspecified: Secondary | ICD-10-CM

## 2019-03-06 MED ORDER — SODIUM CHLORIDE 0.9 % IV SOLN
200.0000 mg | Freq: Once | INTRAVENOUS | Status: AC
  Administered 2019-03-06: 200 mg via INTRAVENOUS
  Filled 2019-03-06: qty 200

## 2019-03-06 MED ORDER — SODIUM CHLORIDE 0.9 % IV SOLN
Freq: Once | INTRAVENOUS | Status: AC
  Filled 2019-03-06: qty 250

## 2019-03-06 NOTE — Patient Instructions (Signed)

## 2019-03-13 ENCOUNTER — Inpatient Hospital Stay: Payer: 59

## 2019-03-13 ENCOUNTER — Other Ambulatory Visit: Payer: Self-pay

## 2019-03-13 VITALS — BP 127/88 | HR 57 | Temp 98.3°F | Resp 17

## 2019-03-13 DIAGNOSIS — D500 Iron deficiency anemia secondary to blood loss (chronic): Secondary | ICD-10-CM

## 2019-03-13 LAB — CBC WITH DIFFERENTIAL (CANCER CENTER ONLY)
Abs Immature Granulocytes: 0 10*3/uL (ref 0.00–0.07)
Basophils Absolute: 0.1 10*3/uL (ref 0.0–0.1)
Basophils Relative: 2 %
Eosinophils Absolute: 0 10*3/uL (ref 0.0–0.5)
Eosinophils Relative: 1 %
HCT: 35.2 % — ABNORMAL LOW (ref 36.0–46.0)
Hemoglobin: 10.8 g/dL — ABNORMAL LOW (ref 12.0–15.0)
Immature Granulocytes: 0 %
Lymphocytes Relative: 54 %
Lymphs Abs: 1.3 10*3/uL (ref 0.7–4.0)
MCH: 28.2 pg (ref 26.0–34.0)
MCHC: 30.7 g/dL (ref 30.0–36.0)
MCV: 91.9 fL (ref 80.0–100.0)
Monocytes Absolute: 0.2 10*3/uL (ref 0.1–1.0)
Monocytes Relative: 8 %
Neutro Abs: 0.8 10*3/uL — ABNORMAL LOW (ref 1.7–7.7)
Neutrophils Relative %: 35 %
Platelet Count: 256 10*3/uL (ref 150–400)
RBC: 3.83 MIL/uL — ABNORMAL LOW (ref 3.87–5.11)
RDW: 20.9 % — ABNORMAL HIGH (ref 11.5–15.5)
WBC Count: 2.4 10*3/uL — ABNORMAL LOW (ref 4.0–10.5)
nRBC: 0 % (ref 0.0–0.2)

## 2019-03-13 LAB — IRON AND TIBC
Iron: 50 ug/dL (ref 41–142)
Saturation Ratios: 19 % — ABNORMAL LOW (ref 21–57)
TIBC: 270 ug/dL (ref 236–444)
UIBC: 220 ug/dL (ref 120–384)

## 2019-03-13 LAB — FERRITIN: Ferritin: 47 ng/mL (ref 11–307)

## 2019-03-13 MED ORDER — SODIUM CHLORIDE 0.9 % IV SOLN
200.0000 mg | Freq: Once | INTRAVENOUS | Status: AC
  Administered 2019-03-13: 15:00:00 200 mg via INTRAVENOUS
  Filled 2019-03-13: qty 200

## 2019-03-13 MED ORDER — SODIUM CHLORIDE 0.9 % IV SOLN
Freq: Once | INTRAVENOUS | Status: AC
  Filled 2019-03-13: qty 250

## 2019-03-14 ENCOUNTER — Ambulatory Visit
Admission: RE | Admit: 2019-03-14 | Discharge: 2019-03-14 | Disposition: A | Discharge status: Home / Self Care | Payer: 59 | Source: Ambulatory Visit | Attending: Obstetrics & Gynecology | Admitting: Obstetrics & Gynecology

## 2019-03-14 DIAGNOSIS — D259 Leiomyoma of uterus, unspecified: Secondary | ICD-10-CM

## 2019-03-18 ENCOUNTER — Telehealth: Payer: Self-pay

## 2019-03-18 NOTE — Telephone Encounter (Signed)
Spoke with patient to let her know that her anemia has improved after 4 doses of Venofer, but not resolved, iron level also much better. IDr. Burr Medico recommends one more dose Venofer.  Patient prefers to not get another infusion of Venofer at this time. She states she feels fine and will continue to take her iron supplement.  Dr. Burr Medico was made aware.  Patient has repeat lab 04/12/2019.

## 2019-04-02 ENCOUNTER — Telehealth: Payer: Self-pay | Admitting: Hematology

## 2019-04-02 NOTE — Telephone Encounter (Signed)
Scheduled per 2/9 staff msg, pt req. Called and left a msg.

## 2019-04-12 ENCOUNTER — Inpatient Hospital Stay: Payer: 59 | Attending: Hematology

## 2019-04-12 ENCOUNTER — Inpatient Hospital Stay: Payer: 59

## 2019-04-16 ENCOUNTER — Telehealth: Payer: Self-pay | Admitting: Hematology

## 2019-04-16 NOTE — Telephone Encounter (Signed)
Rescheduled per 2/19 sch msg, pt req. Called and spoke with pt, confirmed 3/3 appt

## 2019-04-24 ENCOUNTER — Inpatient Hospital Stay: Payer: 59

## 2019-04-24 ENCOUNTER — Other Ambulatory Visit: Payer: Self-pay

## 2019-04-24 ENCOUNTER — Inpatient Hospital Stay: Payer: 59 | Attending: Hematology

## 2019-04-24 VITALS — BP 125/81 | HR 66 | Temp 98.6°F | Resp 16 | Ht 69.5 in | Wt 215.0 lb

## 2019-04-24 DIAGNOSIS — D500 Iron deficiency anemia secondary to blood loss (chronic): Secondary | ICD-10-CM | POA: Diagnosis not present

## 2019-04-24 DIAGNOSIS — D72819 Decreased white blood cell count, unspecified: Secondary | ICD-10-CM | POA: Insufficient documentation

## 2019-04-24 DIAGNOSIS — N920 Excessive and frequent menstruation with regular cycle: Secondary | ICD-10-CM | POA: Diagnosis not present

## 2019-04-24 DIAGNOSIS — Z79899 Other long term (current) drug therapy: Secondary | ICD-10-CM | POA: Diagnosis not present

## 2019-04-24 LAB — CBC WITH DIFFERENTIAL (CANCER CENTER ONLY)
Abs Immature Granulocytes: 0 10*3/uL (ref 0.00–0.07)
Basophils Absolute: 0.1 10*3/uL (ref 0.0–0.1)
Basophils Relative: 2 %
Eosinophils Absolute: 0 10*3/uL (ref 0.0–0.5)
Eosinophils Relative: 1 %
HCT: 35.8 % — ABNORMAL LOW (ref 36.0–46.0)
Hemoglobin: 11.5 g/dL — ABNORMAL LOW (ref 12.0–15.0)
Immature Granulocytes: 0 %
Lymphocytes Relative: 46 %
Lymphs Abs: 1.1 10*3/uL (ref 0.7–4.0)
MCH: 29.7 pg (ref 26.0–34.0)
MCHC: 32.1 g/dL (ref 30.0–36.0)
MCV: 92.5 fL (ref 80.0–100.0)
Monocytes Absolute: 0.2 10*3/uL (ref 0.1–1.0)
Monocytes Relative: 8 %
Neutro Abs: 1 10*3/uL — ABNORMAL LOW (ref 1.7–7.7)
Neutrophils Relative %: 43 %
Platelet Count: 274 10*3/uL (ref 150–400)
RBC: 3.87 MIL/uL (ref 3.87–5.11)
RDW: 17.6 % — ABNORMAL HIGH (ref 11.5–15.5)
WBC Count: 2.4 10*3/uL — ABNORMAL LOW (ref 4.0–10.5)
nRBC: 0 % (ref 0.0–0.2)

## 2019-04-24 LAB — IRON AND TIBC
Iron: 57 ug/dL (ref 41–142)
Saturation Ratios: 23 % (ref 21–57)
TIBC: 251 ug/dL (ref 236–444)
UIBC: 194 ug/dL (ref 120–384)

## 2019-04-24 LAB — FERRITIN: Ferritin: 15 ng/mL (ref 11–307)

## 2019-04-24 MED ORDER — SODIUM CHLORIDE 0.9 % IV SOLN
200.0000 mg | Freq: Once | INTRAVENOUS | Status: AC
  Administered 2019-04-24: 200 mg via INTRAVENOUS
  Filled 2019-04-24: qty 200

## 2019-04-24 MED ORDER — SODIUM CHLORIDE 0.9 % IV SOLN
Freq: Once | INTRAVENOUS | Status: AC
  Filled 2019-04-24: qty 250

## 2019-04-24 NOTE — Patient Instructions (Addendum)

## 2019-04-25 ENCOUNTER — Telehealth: Payer: Self-pay | Admitting: Hematology

## 2019-04-25 ENCOUNTER — Telehealth: Payer: Self-pay

## 2019-04-25 NOTE — Telephone Encounter (Signed)
-----   Message from Truitt Merle, MD sent at 04/25/2019  8:52 AM EST ----- Please let pt know her iron level has dropped again, anemia better after IV iron a few months ago. Please schedule iv venofer weekly X3 for her, thanks  Truitt Merle

## 2019-04-25 NOTE — Telephone Encounter (Signed)
I spoke with Sierra Mejia and let her know her iron has dropped and she needs to have 3 additional infusions.  She states she had one yesterday.  Scheduling message sent for 2 additional infusions of venofer.

## 2019-04-25 NOTE — Telephone Encounter (Signed)
Scheduled appt per 3/4 sch message  - pt is aware of appt date and tie m

## 2019-04-29 ENCOUNTER — Telehealth: Payer: Self-pay | Admitting: Hematology

## 2019-04-29 NOTE — Telephone Encounter (Signed)
Pt called in and left a vmail to reschedule appt for 3/11 - unable to move appt to 4 pm or 430 - left message for pt that we can reschedule for another day .- and for pt to call back to reschedule.

## 2019-05-01 ENCOUNTER — Inpatient Hospital Stay: Payer: 59

## 2019-05-01 ENCOUNTER — Other Ambulatory Visit: Payer: Self-pay

## 2019-05-01 VITALS — BP 126/84 | HR 59 | Temp 97.8°F | Resp 18

## 2019-05-01 DIAGNOSIS — D500 Iron deficiency anemia secondary to blood loss (chronic): Secondary | ICD-10-CM | POA: Diagnosis not present

## 2019-05-01 MED ORDER — SODIUM CHLORIDE 0.9 % IV SOLN
200.0000 mg | Freq: Once | INTRAVENOUS | Status: AC
  Administered 2019-05-01: 200 mg via INTRAVENOUS
  Filled 2019-05-01: qty 200

## 2019-05-01 MED ORDER — SODIUM CHLORIDE 0.9 % IV SOLN
Freq: Once | INTRAVENOUS | Status: AC
  Filled 2019-05-01: qty 250

## 2019-05-01 NOTE — Patient Instructions (Signed)

## 2019-05-02 ENCOUNTER — Inpatient Hospital Stay: Payer: 59

## 2019-05-08 ENCOUNTER — Inpatient Hospital Stay: Payer: 59

## 2019-05-16 ENCOUNTER — Other Ambulatory Visit: Payer: Self-pay | Admitting: Obstetrics & Gynecology

## 2019-06-09 ENCOUNTER — Other Ambulatory Visit: Payer: Self-pay | Admitting: Hematology

## 2019-06-09 DIAGNOSIS — D709 Neutropenia, unspecified: Secondary | ICD-10-CM

## 2019-06-10 ENCOUNTER — Inpatient Hospital Stay: Payer: 59

## 2019-06-10 ENCOUNTER — Inpatient Hospital Stay: Payer: 59 | Attending: Hematology | Admitting: Hematology

## 2019-06-10 DIAGNOSIS — D72819 Decreased white blood cell count, unspecified: Secondary | ICD-10-CM | POA: Insufficient documentation

## 2019-06-10 DIAGNOSIS — D500 Iron deficiency anemia secondary to blood loss (chronic): Secondary | ICD-10-CM

## 2019-06-10 DIAGNOSIS — N920 Excessive and frequent menstruation with regular cycle: Secondary | ICD-10-CM | POA: Insufficient documentation

## 2019-06-10 DIAGNOSIS — Z79899 Other long term (current) drug therapy: Secondary | ICD-10-CM | POA: Insufficient documentation

## 2019-06-14 ENCOUNTER — Telehealth: Payer: Self-pay | Admitting: Hematology

## 2019-06-14 NOTE — Telephone Encounter (Signed)
Called pt per 4/23 sch message- unable to reach pt . Left message for pt to call back to reschedule appts from 4/19

## 2019-06-19 NOTE — Progress Notes (Signed)
[place]   Telephone:(336) [phone] Fax:(336) [phone]   Clinic Follow up Note   Patient Care Team: Waymon Amato, MD as PCP - General (Obstetrics and Gynecology)  Date of Service:  06/19/2019  CHIEF COMPLAINT: F/u ofanemia   CURRENT THERAPY:  IV Feraheme as needed, oral iron (not compliant)    INTERVAL HISTORY:  Sierra Mejia is here for a follow up of anemia. She presents to the clinic alone.  She is clinically doing well, last IV iron was a month ago and she did feel better after that.  Her menstrual period is still regular, but lighter, lasts about 5 days.  She is on oral iron pill once daily, tolerating well.  No other complaints.  Review of system otherwise negative.   MEDICAL HISTORY:  Past Medical History:  Diagnosis Date  . Anemia   . Fibroids   . Wears contact lenses     SURGICAL HISTORY: Past Surgical History:  Procedure Laterality Date  . IR ANGIOGRAM PELVIS SELECTIVE OR SUPRASELECTIVE  02/05/2018  . IR ANGIOGRAM PELVIS SELECTIVE OR SUPRASELECTIVE  02/05/2018  . IR ANGIOGRAM SELECTIVE EACH ADDITIONAL VESSEL  02/05/2018  . IR ANGIOGRAM SELECTIVE EACH ADDITIONAL VESSEL  02/05/2018  . IR EMBO TUMOR ORGAN ISCHEMIA INFARCT INC GUIDE ROADMAPPING  02/05/2018  . IR RADIOLOGIST EVAL & MGMT  12/14/2017  . IR RADIOLOGIST EVAL & MGMT  03/21/2018  . IR RADIOLOGIST EVAL & MGMT  09/19/2018  . IR US GUIDE VASC ACCESS RIGHT  02/05/2018  . MYOMECTOMY    . ORIF ANKLE FRACTURE Left 02/25/2015   Procedure: OPEN REDUCTION INTERNAL FIXATION (ORIF) ANKLE FRACTURE LEFT FIBULAR;  Surgeon: Samara Deist, DPM;  Location: [place];  Service: Podiatry;  Laterality: Left;  WITH POPLITEAL    I have reviewed the social history and family history with the patient and they are unchanged from previous note.  ALLERGIES:  has No Known Allergies.  MEDICATIONS:  Current Outpatient Medications  Medication Sig Dispense Refill  . Ferrous Sulfate (IRON) 325 (65 Fe) MG  TABS Take 1 tablet (325 mg total) by mouth 3 (three) times daily with meals. 90 tablet 3   No current facility-administered medications for this visit.    PHYSICAL EXAMINATION: ECOG PERFORMANCE STATUS: 0 - Asymptomatic  Vitals:   06/21/19 1538  BP: 121/87  Pulse: 63  Resp: 16  Temp: 98.7 F (37.1 C)   Filed Weights   06/21/19 1538  Weight: 207 lb (93.9 kg)     GENERAL:alert, no distress and comfortable SKIN: skin color, texture, turgor are normal, no rashes or significant lesions EYES: normal, Conjunctiva are pink and non-injected, sclera clear Musculoskeletal:no cyanosis of digits and no clubbing  NEURO: alert & oriented x 3 with fluent speech  LABORATORY DATA:  I have reviewed the data as listed CBC Latest Ref Rng & Units 06/21/2019 04/24/2019 03/13/2019  WBC 4.0 - 10.5 K/uL 3.2(L) 2.4(L) 2.4(L)  Hemoglobin 12.0 - 15.0 g/dL 11.4(L) 11.5(L) 10.8(L)  Hematocrit 36.0 - 46.0 % 35.9(L) 35.8(L) 35.2(L)  Platelets 150 - 400 K/uL 308 274 256     CMP Latest Ref Rng & Units 02/05/2018 11/29/2017  Glucose 70 - 99 mg/dL 79 79  BUN 6 - 20 mg/dL 12 12  Creatinine 0.44 - 1.00 mg/dL 0.65 0.67  Sodium 135 - 145 mmol/L 139 144  Potassium 3.5 - 5.1 mmol/L 4.0 3.9  Chloride 98 - 111 mmol/L 108 111  CO2 22 - 32 mmol/L 23 25  Calcium 8.9 -  10.3 mg/dL 8.4(L) 8.8(L)  Total Protein 6.5 - 8.1 g/dL - 6.3(L)  Total Bilirubin 0.3 - 1.2 mg/dL - 0.7  Alkaline Phos 38 - 126 U/L - 56  AST 15 - 41 U/L - 20  ALT 0 - 44 U/L - 16      RADIOGRAPHIC STUDIES: I have personally reviewed the radiological images as listed and agreed with the findings in the report. No results found.   ASSESSMENT & PLAN:  Sierra Mejia is a 39 y.o. female with    1.Iron deficient anemia, secondary tomenorrhagia -She haschronic anemia and iron deficiency, previouslyrequired blood transfusion once and IVironin 2018 and 2019.  -She has menorrhagia from fibroid. This is likely the cause of her IDA.She had a 3  months Lupron injection in 12/2017 to stop bleeding before her endometrial ablation in 01/2018. She has had  light period since then, with some spotting -She had a negative stool OB, no other signs of bleeding.  -She will continue oral iron.  She is not very compliant, she tolerates once daily much better than three times a day  -Last IV iron was a month ago. -Lab reviewed, hemoglobin 11.4, stable from a month ago.  Iron study results still pending -Continue lab follow-up every 6 weeks -IV iron if ferritin less than 30   2. Mild Leukopenia -With mild intermittent neutropenia. We did consider the possibility of immune related neutropenia, or mild leukopenia secondary to her ethnicity -Has been chronically low since 2019. Given this is mild and stable. I recommend continuing observation, no indication of treatment at this time.     PLAN: -Lab reviewed, iron study results still pending -We will set up IV Venofer if ferritin less than 30 -Continue monitoring labs every 6 weeks -Follow-up in 6 months   No problem-specific Assessment & Plan notes found for this encounter.   No orders of the defined types were placed in this encounter.  All questions were answered. The patient knows to call the clinic with any problems, questions or concerns. No barriers to learning was detected. The total time spent in the appointment was 20 minutes.     Truitt Merle, MD 06/19/2019   I, Joslyn Devon, am acting as scribe for Truitt Merle, MD.   I have reviewed the above documentation for accuracy and completeness, and I agree with the above.

## 2019-06-21 ENCOUNTER — Inpatient Hospital Stay (HOSPITAL_BASED_OUTPATIENT_CLINIC_OR_DEPARTMENT_OTHER): Payer: 59 | Admitting: Hematology

## 2019-06-21 ENCOUNTER — Inpatient Hospital Stay: Payer: 59

## 2019-06-21 ENCOUNTER — Other Ambulatory Visit: Payer: Self-pay

## 2019-06-21 VITALS — BP 121/87 | HR 63 | Temp 98.7°F | Resp 16 | Ht 69.5 in | Wt 207.0 lb

## 2019-06-21 DIAGNOSIS — D500 Iron deficiency anemia secondary to blood loss (chronic): Secondary | ICD-10-CM | POA: Diagnosis present

## 2019-06-21 DIAGNOSIS — N920 Excessive and frequent menstruation with regular cycle: Secondary | ICD-10-CM | POA: Diagnosis not present

## 2019-06-21 DIAGNOSIS — D72819 Decreased white blood cell count, unspecified: Secondary | ICD-10-CM | POA: Diagnosis not present

## 2019-06-21 DIAGNOSIS — D709 Neutropenia, unspecified: Secondary | ICD-10-CM

## 2019-06-21 DIAGNOSIS — Z79899 Other long term (current) drug therapy: Secondary | ICD-10-CM | POA: Diagnosis not present

## 2019-06-21 LAB — CBC WITH DIFFERENTIAL (CANCER CENTER ONLY)
Abs Immature Granulocytes: 0 10*3/uL (ref 0.00–0.07)
Basophils Absolute: 0.1 10*3/uL (ref 0.0–0.1)
Basophils Relative: 2 %
Eosinophils Absolute: 0 10*3/uL (ref 0.0–0.5)
Eosinophils Relative: 1 %
HCT: 35.9 % — ABNORMAL LOW (ref 36.0–46.0)
Hemoglobin: 11.4 g/dL — ABNORMAL LOW (ref 12.0–15.0)
Immature Granulocytes: 0 %
Lymphocytes Relative: 34 %
Lymphs Abs: 1.1 10*3/uL (ref 0.7–4.0)
MCH: 31.1 pg (ref 26.0–34.0)
MCHC: 31.8 g/dL (ref 30.0–36.0)
MCV: 98.1 fL (ref 80.0–100.0)
Monocytes Absolute: 0.3 10*3/uL (ref 0.1–1.0)
Monocytes Relative: 8 %
Neutro Abs: 1.8 10*3/uL (ref 1.7–7.7)
Neutrophils Relative %: 55 %
Platelet Count: 308 10*3/uL (ref 150–400)
RBC: 3.66 MIL/uL — ABNORMAL LOW (ref 3.87–5.11)
RDW: 13.9 % (ref 11.5–15.5)
WBC Count: 3.2 10*3/uL — ABNORMAL LOW (ref 4.0–10.5)
nRBC: 0 % (ref 0.0–0.2)

## 2019-06-21 LAB — VITAMIN B12: Vitamin B-12: 192 pg/mL (ref 180–914)

## 2019-06-22 ENCOUNTER — Encounter: Payer: Self-pay | Admitting: Hematology

## 2019-06-23 LAB — FOLATE RBC
Folate, Hemolysate: 324 ng/mL
Folate, RBC: 942 ng/mL (ref >498)
Hematocrit: 34.4 % (ref 34.0–46.6)

## 2019-06-24 ENCOUNTER — Telehealth: Payer: Self-pay | Admitting: Hematology

## 2019-06-24 LAB — IRON AND TIBC
Iron: 39 ug/dL — ABNORMAL LOW (ref 41–142)
Saturation Ratios: 19 % — ABNORMAL LOW (ref 21–57)
TIBC: 209 ug/dL — ABNORMAL LOW (ref 236–444)
UIBC: 170 ug/dL (ref 120–384)

## 2019-06-24 LAB — FERRITIN: Ferritin: 28 ng/mL (ref 11–307)

## 2019-06-24 NOTE — Telephone Encounter (Signed)
Scheduled appt per 4/30 los.  Spoke with pt and she is aware of her appt dates and time

## 2019-06-27 LAB — METHYLMALONIC ACID, SERUM: Methylmalonic Acid, Quantitative: 163 nmol/L (ref 0–378)

## 2019-07-08 ENCOUNTER — Telehealth: Payer: Self-pay

## 2019-07-08 NOTE — Telephone Encounter (Signed)
-----   Message from Truitt Merle, MD sent at 07/02/2019  9:57 PM EDT ----- Please let pt know that her iron level is still on low normal end. I recommend venofer weekly X2 starting this Friday, and add venofer infusion to every scheduled future lab appointment (she required a total of 2.5g iv iron last year, so every one dose venofer every month), if she agrees, thanks   Thanks   [employer]

## 2019-07-08 NOTE — Telephone Encounter (Signed)
I spoke with Sierra Mejia and relayed Dr. Ernestina Penna message.  She will call me back with her decision regarding venofer infusions.

## 2019-07-31 ENCOUNTER — Telehealth: Payer: Self-pay | Admitting: Hematology

## 2019-07-31 NOTE — Telephone Encounter (Signed)
Called pt per 6/9 sch message - unable to reach pt .left message for patient with new appt.

## 2019-08-02 ENCOUNTER — Other Ambulatory Visit: Payer: 59

## 2019-08-02 ENCOUNTER — Inpatient Hospital Stay: Payer: 59 | Attending: Hematology

## 2019-08-02 ENCOUNTER — Other Ambulatory Visit: Payer: Self-pay

## 2019-08-02 DIAGNOSIS — D72819 Decreased white blood cell count, unspecified: Secondary | ICD-10-CM | POA: Diagnosis not present

## 2019-08-02 DIAGNOSIS — N920 Excessive and frequent menstruation with regular cycle: Secondary | ICD-10-CM | POA: Diagnosis not present

## 2019-08-02 DIAGNOSIS — D500 Iron deficiency anemia secondary to blood loss (chronic): Secondary | ICD-10-CM | POA: Diagnosis present

## 2019-08-02 DIAGNOSIS — Z79899 Other long term (current) drug therapy: Secondary | ICD-10-CM | POA: Insufficient documentation

## 2019-08-02 LAB — CBC WITH DIFFERENTIAL (CANCER CENTER ONLY)
Abs Immature Granulocytes: 0 10*3/uL (ref 0.00–0.07)
Basophils Absolute: 0.1 10*3/uL (ref 0.0–0.1)
Basophils Relative: 2 %
Eosinophils Absolute: 0.1 10*3/uL (ref 0.0–0.5)
Eosinophils Relative: 3 %
HCT: 34 % — ABNORMAL LOW (ref 36.0–46.0)
Hemoglobin: 11 g/dL — ABNORMAL LOW (ref 12.0–15.0)
Immature Granulocytes: 0 %
Lymphocytes Relative: 53 %
Lymphs Abs: 1.2 10*3/uL (ref 0.7–4.0)
MCH: 31.8 pg (ref 26.0–34.0)
MCHC: 32.4 g/dL (ref 30.0–36.0)
MCV: 98.3 fL (ref 80.0–100.0)
Monocytes Absolute: 0.2 10*3/uL (ref 0.1–1.0)
Monocytes Relative: 7 %
Neutro Abs: 0.8 10*3/uL — ABNORMAL LOW (ref 1.7–7.7)
Neutrophils Relative %: 35 %
Platelet Count: 270 10*3/uL (ref 150–400)
RBC: 3.46 MIL/uL — ABNORMAL LOW (ref 3.87–5.11)
RDW: 13.6 % (ref 11.5–15.5)
WBC Count: 2.3 10*3/uL — ABNORMAL LOW (ref 4.0–10.5)
nRBC: 0 % (ref 0.0–0.2)

## 2019-08-02 LAB — IRON AND TIBC
Iron: 30 ug/dL — ABNORMAL LOW (ref 41–142)
Saturation Ratios: 10 % — ABNORMAL LOW (ref 21–57)
TIBC: 303 ug/dL (ref 236–444)
UIBC: 273 ug/dL (ref 120–384)

## 2019-08-02 LAB — FERRITIN: Ferritin: <4 ng/mL — ABNORMAL LOW (ref 11–307)

## 2019-08-12 ENCOUNTER — Telehealth: Payer: Self-pay | Admitting: Emergency Medicine

## 2019-08-12 NOTE — Telephone Encounter (Addendum)
Pt verbalized understanding. Scheduling message sent.   ----- Message from Truitt Merle, MD sent at 08/11/2019  8:21 AM EDT ----- Please let pt know her iron level is low again, and schedule iv venofer twice weekly X5 times, thanks  Truitt Merle

## 2019-08-13 ENCOUNTER — Telehealth: Payer: Self-pay | Admitting: Hematology

## 2019-08-13 NOTE — Telephone Encounter (Signed)
Scheduled appt per 6/21 sch message - pt is aware of appts added.   

## 2019-08-19 ENCOUNTER — Other Ambulatory Visit: Payer: Self-pay

## 2019-08-19 ENCOUNTER — Inpatient Hospital Stay: Payer: 59

## 2019-08-19 VITALS — BP 116/73 | HR 70 | Temp 98.8°F | Resp 18

## 2019-08-19 DIAGNOSIS — D500 Iron deficiency anemia secondary to blood loss (chronic): Secondary | ICD-10-CM

## 2019-08-19 MED ORDER — SODIUM CHLORIDE 0.9 % IV SOLN
Freq: Once | INTRAVENOUS | Status: AC
  Filled 2019-08-19: qty 250

## 2019-08-19 MED ORDER — SODIUM CHLORIDE 0.9 % IV SOLN
200.0000 mg | Freq: Once | INTRAVENOUS | Status: AC
  Administered 2019-08-19: 200 mg via INTRAVENOUS
  Filled 2019-08-19: qty 200

## 2019-08-19 NOTE — Progress Notes (Signed)
Pt declined to stay for 30-minute post-observation after venofer

## 2019-08-19 NOTE — Patient Instructions (Signed)

## 2019-08-23 ENCOUNTER — Other Ambulatory Visit: Payer: Self-pay

## 2019-08-23 ENCOUNTER — Telehealth: Payer: Self-pay | Admitting: Hematology

## 2019-08-23 ENCOUNTER — Inpatient Hospital Stay: Payer: 59 | Attending: Hematology

## 2019-08-23 VITALS — BP 119/77 | HR 64 | Temp 98.5°F | Resp 18

## 2019-08-23 DIAGNOSIS — N920 Excessive and frequent menstruation with regular cycle: Secondary | ICD-10-CM | POA: Diagnosis not present

## 2019-08-23 DIAGNOSIS — D500 Iron deficiency anemia secondary to blood loss (chronic): Secondary | ICD-10-CM

## 2019-08-23 DIAGNOSIS — Z79899 Other long term (current) drug therapy: Secondary | ICD-10-CM | POA: Diagnosis not present

## 2019-08-23 DIAGNOSIS — D72819 Decreased white blood cell count, unspecified: Secondary | ICD-10-CM | POA: Diagnosis not present

## 2019-08-23 MED ORDER — SODIUM CHLORIDE 0.9 % IV SOLN
Freq: Once | INTRAVENOUS | Status: AC
  Filled 2019-08-23: qty 250

## 2019-08-23 MED ORDER — SODIUM CHLORIDE 0.9 % IV SOLN
200.0000 mg | Freq: Once | INTRAVENOUS | Status: AC
  Administered 2019-08-23: 200 mg via INTRAVENOUS
  Filled 2019-08-23: qty 200

## 2019-08-23 NOTE — Telephone Encounter (Signed)
R/s due to changes in template. Pt Is aware of appt time and date.

## 2019-08-28 ENCOUNTER — Other Ambulatory Visit: Payer: Self-pay

## 2019-08-28 ENCOUNTER — Inpatient Hospital Stay: Payer: 59

## 2019-08-28 VITALS — BP 128/80 | HR 61 | Temp 98.3°F | Resp 17

## 2019-08-28 DIAGNOSIS — D500 Iron deficiency anemia secondary to blood loss (chronic): Secondary | ICD-10-CM | POA: Diagnosis not present

## 2019-08-28 MED ORDER — SODIUM CHLORIDE 0.9 % IV SOLN
200.0000 mg | Freq: Once | INTRAVENOUS | Status: AC
  Administered 2019-08-28: 200 mg via INTRAVENOUS
  Filled 2019-08-28: qty 200

## 2019-08-28 MED ORDER — SODIUM CHLORIDE 0.9 % IV SOLN
Freq: Once | INTRAVENOUS | Status: AC
  Filled 2019-08-28: qty 250

## 2019-08-28 NOTE — Patient Instructions (Signed)

## 2019-08-30 ENCOUNTER — Telehealth: Payer: Self-pay | Admitting: Hematology

## 2019-08-30 ENCOUNTER — Ambulatory Visit: Payer: 59

## 2019-08-30 NOTE — Telephone Encounter (Signed)
Rescheduled appointments per patient's request, patient has been called and voicemail was left regarding upcoming appointments.

## 2019-09-02 ENCOUNTER — Inpatient Hospital Stay: Payer: 59

## 2019-09-09 ENCOUNTER — Other Ambulatory Visit: Payer: Self-pay | Admitting: Hematology

## 2019-09-09 ENCOUNTER — Telehealth: Payer: Self-pay

## 2019-09-09 ENCOUNTER — Ambulatory Visit: Payer: 59

## 2019-09-09 NOTE — Telephone Encounter (Signed)
Called Mrs. Bristol as she missed her appt today.  She said no one informed her of this appt time.  She will be here on Friday at 1230 for labs and I have rescheduled her for IV iron at 1300.  Please call her if you need to make changes.  She said she may have missed her last two iron infusions as well.  Gardiner Rhyme, RN

## 2019-09-11 ENCOUNTER — Other Ambulatory Visit: Payer: Self-pay | Admitting: Hematology

## 2019-09-13 ENCOUNTER — Other Ambulatory Visit: Payer: Self-pay

## 2019-09-13 ENCOUNTER — Inpatient Hospital Stay: Payer: 59

## 2019-09-13 VITALS — BP 113/74 | HR 65 | Temp 99.0°F | Resp 18

## 2019-09-13 DIAGNOSIS — D500 Iron deficiency anemia secondary to blood loss (chronic): Secondary | ICD-10-CM

## 2019-09-13 LAB — IRON AND TIBC
Iron: 59 ug/dL (ref 41–142)
Saturation Ratios: 25 % (ref 21–57)
TIBC: 230 ug/dL — ABNORMAL LOW (ref 236–444)
UIBC: 171 ug/dL (ref 120–384)

## 2019-09-13 LAB — FERRITIN: Ferritin: 49 ng/mL (ref 11–307)

## 2019-09-13 LAB — CBC WITH DIFFERENTIAL (CANCER CENTER ONLY)
Abs Immature Granulocytes: 0 10*3/uL (ref 0.00–0.07)
Basophils Absolute: 0.1 10*3/uL (ref 0.0–0.1)
Basophils Relative: 2 %
Eosinophils Absolute: 0 10*3/uL (ref 0.0–0.5)
Eosinophils Relative: 0 %
HCT: 34.4 % — ABNORMAL LOW (ref 36.0–46.0)
Hemoglobin: 11.2 g/dL — ABNORMAL LOW (ref 12.0–15.0)
Immature Granulocytes: 0 %
Lymphocytes Relative: 40 %
Lymphs Abs: 0.9 10*3/uL (ref 0.7–4.0)
MCH: 31.8 pg (ref 26.0–34.0)
MCHC: 32.6 g/dL (ref 30.0–36.0)
MCV: 97.7 fL (ref 80.0–100.0)
Monocytes Absolute: 0.2 10*3/uL (ref 0.1–1.0)
Monocytes Relative: 8 %
Neutro Abs: 1.1 10*3/uL — ABNORMAL LOW (ref 1.7–7.7)
Neutrophils Relative %: 50 %
Platelet Count: 242 10*3/uL (ref 150–400)
RBC: 3.52 MIL/uL — ABNORMAL LOW (ref 3.87–5.11)
RDW: 14.5 % (ref 11.5–15.5)
WBC Count: 2.3 10*3/uL — ABNORMAL LOW (ref 4.0–10.5)
nRBC: 0 % (ref 0.0–0.2)

## 2019-09-13 MED ORDER — SODIUM CHLORIDE 0.9 % IV SOLN
INTRAVENOUS | Status: DC
  Filled 2019-09-13: qty 250

## 2019-09-13 MED ORDER — SODIUM CHLORIDE 0.9 % IV SOLN
400.0000 mg | Freq: Once | INTRAVENOUS | Status: AC
  Administered 2019-09-13: 400 mg via INTRAVENOUS
  Filled 2019-09-13: qty 20

## 2019-09-13 NOTE — Patient Instructions (Signed)

## 2019-09-17 ENCOUNTER — Encounter: Payer: Self-pay | Admitting: Hematology

## 2019-10-25 ENCOUNTER — Other Ambulatory Visit: Payer: Self-pay

## 2019-10-25 ENCOUNTER — Inpatient Hospital Stay: Payer: 59 | Attending: Hematology

## 2019-10-25 DIAGNOSIS — D500 Iron deficiency anemia secondary to blood loss (chronic): Secondary | ICD-10-CM

## 2019-10-25 LAB — CBC WITH DIFFERENTIAL (CANCER CENTER ONLY)
Abs Immature Granulocytes: 0 10*3/uL (ref 0.00–0.07)
Basophils Absolute: 0.1 10*3/uL (ref 0.0–0.1)
Basophils Relative: 2 %
Eosinophils Absolute: 0 10*3/uL (ref 0.0–0.5)
Eosinophils Relative: 1 %
HCT: 36.4 % (ref 36.0–46.0)
Hemoglobin: 11.6 g/dL — ABNORMAL LOW (ref 12.0–15.0)
Immature Granulocytes: 0 %
Lymphocytes Relative: 43 %
Lymphs Abs: 1.2 10*3/uL (ref 0.7–4.0)
MCH: 31.4 pg (ref 26.0–34.0)
MCHC: 31.9 g/dL (ref 30.0–36.0)
MCV: 98.6 fL (ref 80.0–100.0)
Monocytes Absolute: 0.3 10*3/uL (ref 0.1–1.0)
Monocytes Relative: 9 %
Neutro Abs: 1.3 10*3/uL — ABNORMAL LOW (ref 1.7–7.7)
Neutrophils Relative %: 45 %
Platelet Count: 236 10*3/uL (ref 150–400)
RBC: 3.69 MIL/uL — ABNORMAL LOW (ref 3.87–5.11)
RDW: 14.4 % (ref 11.5–15.5)
WBC Count: 2.8 10*3/uL — ABNORMAL LOW (ref 4.0–10.5)
nRBC: 0 % (ref 0.0–0.2)

## 2019-10-25 LAB — IRON AND TIBC
Iron: 103 ug/dL (ref 41–142)
Saturation Ratios: 40 % (ref 21–57)
TIBC: 259 ug/dL (ref 236–444)
UIBC: 155 ug/dL (ref 120–384)

## 2019-10-25 LAB — FERRITIN: Ferritin: 25 ng/mL (ref 11–307)

## 2019-10-29 ENCOUNTER — Telehealth: Payer: Self-pay

## 2019-10-29 ENCOUNTER — Other Ambulatory Visit: Payer: Self-pay

## 2019-10-29 DIAGNOSIS — D500 Iron deficiency anemia secondary to blood loss (chronic): Secondary | ICD-10-CM

## 2019-10-29 MED ORDER — IRON 325 (65 FE) MG PO TABS: 1.0000 | ORAL_TABLET | Freq: Three times a day (TID) | ORAL | 3 refills | Status: AC

## 2019-10-29 NOTE — Telephone Encounter (Signed)
I reviewed Dr. Ernestina Penna comments and recommendations with Sierra Mejia.  She prefers not to have venofer infusion but wants to continue with oral iron.  She is only taking 1 tablet per day.  Per previous prescription she is to be taking 3 tablets per day.  She is going to increase oral iron to 3 times per day taken with vitamin c.  A new prescription as sent to her pharmacy.

## 2019-10-29 NOTE — Telephone Encounter (Signed)
-----   Message from Truitt Merle, MD sent at 10/26/2019  7:48 PM EDT ----- Please let pt know her lab results, iron below goal, pls schedule Venofer 400mg  twice in next 2-3 weeks, thanks   Truitt Merle

## 2019-12-04 NOTE — Progress Notes (Signed)
[place]   Telephone:(336) [phone] Fax:(336) [phone]   Clinic Follow up Note   Patient Care Team: Waymon Amato, MD as PCP - General (Obstetrics and Gynecology)  Date of Service:  12/06/2019  CHIEF COMPLAINT: F/u ofanemia   CURRENT THERAPY:  Oral iron 1-2 times a day (not consistently due to tolerance)  IV Feraheme as needed for ferritin <20-30 since 07/2016. Switched to IV Venofer in 01/2019. Increase Venofer dose to 400mg  from 12/06/19.   INTERVAL HISTORY:  Sierra Mejia is here for a follow up of anemia. She was last seen by em 6 months ago. She presents to the clinic alone. She notes she is doing well. She notes her recent energy is adequate. She notes only being very fatigued with severe anemia. She notes constipated is controlled with diet. She notes she has to take oral iron with meal to help tolerate it. She also takes it with orange juice. She notes her periods have picked up again. She is not currently on birth control as it did not help in the past. She notes she would like to have as little IV iron infusions as possible and rather rely on oral iron.    REVIEW OF SYSTEMS:   Constitutional: Denies fevers, chills or abnormal weight loss Eyes: Denies blurriness of vision Ears, nose, mouth, throat, and face: Denies mucositis or sore throat Respiratory: Denies cough, dyspnea or wheezes Cardiovascular: Denies palpitation, chest discomfort or lower extremity swelling Gastrointestinal:  Denies nausea, heartburn or change in bowel habits Skin: Denies abnormal skin rashes Lymphatics: Denies new lymphadenopathy or easy bruising Neurological:Denies numbness, tingling or new weaknesses Behavioral/Psych: Mood is stable, no new changes  All other systems were reviewed with the patient and are negative.  MEDICAL HISTORY:  Past Medical History:  Diagnosis Date  . Anemia   . Fibroids   . Wears contact lenses     SURGICAL HISTORY: Past Surgical History:    Procedure Laterality Date  . IR ANGIOGRAM PELVIS SELECTIVE OR SUPRASELECTIVE  02/05/2018  . IR ANGIOGRAM PELVIS SELECTIVE OR SUPRASELECTIVE  02/05/2018  . IR ANGIOGRAM SELECTIVE EACH ADDITIONAL VESSEL  02/05/2018  . IR ANGIOGRAM SELECTIVE EACH ADDITIONAL VESSEL  02/05/2018  . IR EMBO TUMOR ORGAN ISCHEMIA INFARCT INC GUIDE ROADMAPPING  02/05/2018  . IR RADIOLOGIST EVAL & MGMT  12/14/2017  . IR RADIOLOGIST EVAL & MGMT  03/21/2018  . IR RADIOLOGIST EVAL & MGMT  09/19/2018  . IR US GUIDE VASC ACCESS RIGHT  02/05/2018  . MYOMECTOMY    . ORIF ANKLE FRACTURE Left 02/25/2015   Procedure: OPEN REDUCTION INTERNAL FIXATION (ORIF) ANKLE FRACTURE LEFT FIBULAR;  Surgeon: Samara Deist, DPM;  Location: [place];  Service: Podiatry;  Laterality: Left;  WITH POPLITEAL    I have reviewed the social history and family history with the patient and they are unchanged from previous note.  ALLERGIES:  has No Known Allergies.  MEDICATIONS:  Current Outpatient Medications  Medication Sig Dispense Refill  . Ferrous Sulfate (IRON) 325 (65 Fe) MG TABS Take 1 tablet (325 mg total) by mouth 3 (three) times daily with meals. 90 tablet 3   No current facility-administered medications for this visit.    PHYSICAL EXAMINATION: ECOG PERFORMANCE STATUS: 0 - Asymptomatic  Vitals:   12/06/19 1012  BP: 110/82  Pulse: 63  Resp: 17  Temp: 99.7 F (37.6 C)  SpO2: 100%   Filed Weights   12/06/19 1012  Weight: 214 lb 14.4 oz (97.5 kg)  Due to [place] we will limit examination to appearance. Patient had no complaints.  GENERAL:alert, no distress and comfortable SKIN: skin color normal, no rashes or significant lesions EYES: normal, Conjunctiva are pink and non-injected, sclera clear  NEURO: alert & oriented x 3 with fluent speech   LABORATORY DATA:  I have reviewed the data as listed CBC Latest Ref Rng & Units 12/06/2019 10/25/2019 09/13/2019  WBC 4.0 - 10.5 K/uL 2.4(L) 2.8(L) 2.3(L)  Hemoglobin 12.0  - 15.0 g/dL 11.3(L) 11.6(L) 11.2(L)  Hematocrit 36 - 46 % 34.4(L) 36.4 34.4(L)  Platelets 150 - 400 K/uL 243 236 242     CMP Latest Ref Rng & Units 02/05/2018 11/29/2017  Glucose 70 - 99 mg/dL 79 79  BUN 6 - 20 mg/dL 12 12  Creatinine 0.44 - 1.00 mg/dL 0.65 0.67  Sodium 135 - 145 mmol/L 139 144  Potassium 3.5 - 5.1 mmol/L 4.0 3.9  Chloride 98 - 111 mmol/L 108 111  CO2 22 - 32 mmol/L 23 25  Calcium 8.9 - 10.3 mg/dL 8.4(L) 8.8(L)  Total Protein 6.5 - 8.1 g/dL - 6.3(L)  Total Bilirubin 0.3 - 1.2 mg/dL - 0.7  Alkaline Phos 38 - 126 U/L - 56  AST 15 - 41 U/L - 20  ALT 0 - 44 U/L - 16      RADIOGRAPHIC STUDIES: I have personally reviewed the radiological images as listed and agreed with the findings in the report. No results found.   ASSESSMENT & PLAN:  Sierra Mejia is a 39 y.o. female with   1.Iron deficient anemia, secondary tomenorrhagia -She haschronic anemia and iron deficiency, previouslyrequired blood transfusion once and IVironin 2018 and 2019.  -She had a negative stool OB in 09/2017.  -She has menorrhagia from fibroid. This is likely the cause of her IDA.She has tried birth control in the past without much help. She had 3 months Lupron injection in 12/2017 and endometrial ablation in 01/2018. Her period did lighten up but has increased again lately.  -She is currently on oral iron 1-2 times a day. She is also on IV Feraheme for ferritin<30 as needed since 07/2016. Switched to IV Venofer in 01/2019. Her last 3 doses were in 08/2019. She has required IV iron about every 4-6 months.  -She is clinically doing well. Her energy is adequate. Labs reviewed, Hg at 11.3 today. Her hg has been stable in 11 range lately. Ferritin is still pending.   -She notes wanting to hold IV Iron when possible. I would recommend IV iron with Ferritin 20 or below to avoid worsening anemia. Will monitor labs closer, every 6 weeks. Will increase Venofer dose to 400mg  moving forward to reduce  frequency.  -Continue oral iron. She is on 2-3 times daily, more compliant lately. I encouraged her to take three times a day with orange juice and increase red meat in diet.  -F/u in 24 weeks    2. Mild Leukopenia -With mild neutropenia. We did consider the possibility of immune related neutropenia, or mild leukopenia secondary to her ethnicity. Has been chronically low since 2019.  -Remains mild and stable. Will continuing observation, no indication of treatment at this time.    PLAN: -Continue oral iron  -Lab every 6 weeks. We will set up IV Venofer 400mg  X2-3 times if ferritin less than 20 -F/u in 24 weeks    No problem-specific Assessment & Plan notes found for this encounter.   No orders of the defined types were placed in this encounter.  All questions were answered. The patient knows to call the clinic with any problems, questions or concerns. No barriers to learning was detected. The total time spent in the appointment was 25 minutes.     Truitt Merle, MD 12/06/2019   I, Joslyn Devon, am acting as scribe for Truitt Merle, MD.   I have reviewed the above documentation for accuracy and completeness, and I agree with the above.

## 2019-12-06 ENCOUNTER — Encounter: Payer: Self-pay | Admitting: Hematology

## 2019-12-06 ENCOUNTER — Other Ambulatory Visit: Payer: 59

## 2019-12-06 ENCOUNTER — Inpatient Hospital Stay: Payer: 59 | Attending: Hematology

## 2019-12-06 ENCOUNTER — Inpatient Hospital Stay: Payer: 59 | Admitting: Hematology

## 2019-12-06 ENCOUNTER — Other Ambulatory Visit: Payer: Self-pay

## 2019-12-06 ENCOUNTER — Inpatient Hospital Stay: Payer: 59

## 2019-12-06 ENCOUNTER — Telehealth: Payer: Self-pay | Admitting: Hematology

## 2019-12-06 ENCOUNTER — Ambulatory Visit: Payer: 59 | Admitting: Hematology

## 2019-12-06 VITALS — BP 110/82 | HR 63 | Temp 99.7°F | Resp 17 | Ht 69.5 in | Wt 214.9 lb

## 2019-12-06 DIAGNOSIS — Z79899 Other long term (current) drug therapy: Secondary | ICD-10-CM | POA: Insufficient documentation

## 2019-12-06 DIAGNOSIS — D72819 Decreased white blood cell count, unspecified: Secondary | ICD-10-CM | POA: Diagnosis not present

## 2019-12-06 DIAGNOSIS — N920 Excessive and frequent menstruation with regular cycle: Secondary | ICD-10-CM | POA: Diagnosis not present

## 2019-12-06 DIAGNOSIS — D500 Iron deficiency anemia secondary to blood loss (chronic): Secondary | ICD-10-CM

## 2019-12-06 DIAGNOSIS — R5383 Other fatigue: Secondary | ICD-10-CM | POA: Diagnosis not present

## 2019-12-06 DIAGNOSIS — K5900 Constipation, unspecified: Secondary | ICD-10-CM | POA: Insufficient documentation

## 2019-12-06 LAB — IRON AND TIBC
Iron: 101 ug/dL (ref 41–142)
Saturation Ratios: 41 % (ref 21–57)
TIBC: 244 ug/dL (ref 236–444)
UIBC: 143 ug/dL (ref 120–384)

## 2019-12-06 LAB — CBC WITH DIFFERENTIAL (CANCER CENTER ONLY)
Abs Immature Granulocytes: 0 10*3/uL (ref 0.00–0.07)
Basophils Absolute: 0 10*3/uL (ref 0.0–0.1)
Basophils Relative: 2 %
Eosinophils Absolute: 0 10*3/uL (ref 0.0–0.5)
Eosinophils Relative: 1 %
HCT: 34.4 % — ABNORMAL LOW (ref 36.0–46.0)
Hemoglobin: 11.3 g/dL — ABNORMAL LOW (ref 12.0–15.0)
Immature Granulocytes: 0 %
Lymphocytes Relative: 48 %
Lymphs Abs: 1.1 10*3/uL (ref 0.7–4.0)
MCH: 31.6 pg (ref 26.0–34.0)
MCHC: 32.8 g/dL (ref 30.0–36.0)
MCV: 96.1 fL (ref 80.0–100.0)
Monocytes Absolute: 0.2 10*3/uL (ref 0.1–1.0)
Monocytes Relative: 8 %
Neutro Abs: 1 10*3/uL — ABNORMAL LOW (ref 1.7–7.7)
Neutrophils Relative %: 41 %
Platelet Count: 243 10*3/uL (ref 150–400)
RBC: 3.58 MIL/uL — ABNORMAL LOW (ref 3.87–5.11)
RDW: 13.7 % (ref 11.5–15.5)
WBC Count: 2.4 10*3/uL — ABNORMAL LOW (ref 4.0–10.5)
nRBC: 0 % (ref 0.0–0.2)

## 2019-12-06 LAB — FERRITIN: Ferritin: 19 ng/mL (ref 11–307)

## 2019-12-06 NOTE — Telephone Encounter (Signed)
Scheduled per 10/15 los. Pt requested first lab appt the week after thanksgiving due to work schedule and then all appts to be on wednesdays. Pt is aware of appt times and dates. No avs or calendar needed. Pt is mychart active.

## 2019-12-10 ENCOUNTER — Telehealth: Payer: Self-pay

## 2019-12-10 NOTE — Telephone Encounter (Signed)
-----   Message from Truitt Merle, MD sent at 12/07/2019  4:34 PM EDT ----- Please let pt know her ferritin level was 19, so I recommend iv venofer 400mg  weekly X2, if she agrees, please set up. If she does not want IV iron, then continue oral iron, and wait for next lab results. Thanks   Truitt Merle  12/07/2019

## 2019-12-10 NOTE — Telephone Encounter (Signed)
I left vm for MS Candy with Dr. Ernestina Penna recommendations.  I requested she return my call or contact me via mychart.

## 2019-12-11 NOTE — Telephone Encounter (Signed)
Sierra Mejia returned my call.  I reviewed Dr. Lewayne Bunting comments and recommendations.  Sierra Mejia declines the venofer infusion at this time.  She will continue with the oral iron.  I encouraged her to call if she would like the venofer infusion.  She verbalized understanding.

## 2020-01-20 ENCOUNTER — Other Ambulatory Visit: Payer: Self-pay

## 2020-01-20 ENCOUNTER — Inpatient Hospital Stay: Payer: 59 | Attending: Hematology

## 2020-01-20 DIAGNOSIS — D500 Iron deficiency anemia secondary to blood loss (chronic): Secondary | ICD-10-CM | POA: Diagnosis not present

## 2020-01-20 LAB — IRON AND TIBC
Iron: 103 ug/dL (ref 41–142)
Saturation Ratios: 40 % (ref 21–57)
TIBC: 255 ug/dL (ref 236–444)
UIBC: 152 ug/dL (ref 120–384)

## 2020-01-20 LAB — CBC WITH DIFFERENTIAL (CANCER CENTER ONLY)
Abs Immature Granulocytes: 0 10*3/uL (ref 0.00–0.07)
Basophils Absolute: 0.1 10*3/uL (ref 0.0–0.1)
Basophils Relative: 2 %
Eosinophils Absolute: 0 10*3/uL (ref 0.0–0.5)
Eosinophils Relative: 2 %
HCT: 37.1 % (ref 36.0–46.0)
Hemoglobin: 12.1 g/dL (ref 12.0–15.0)
Immature Granulocytes: 0 %
Lymphocytes Relative: 42 %
Lymphs Abs: 1 10*3/uL (ref 0.7–4.0)
MCH: 31.6 pg (ref 26.0–34.0)
MCHC: 32.6 g/dL (ref 30.0–36.0)
MCV: 96.9 fL (ref 80.0–100.0)
Monocytes Absolute: 0.3 10*3/uL (ref 0.1–1.0)
Monocytes Relative: 11 %
Neutro Abs: 1 10*3/uL — ABNORMAL LOW (ref 1.7–7.7)
Neutrophils Relative %: 43 %
Platelet Count: 238 10*3/uL (ref 150–400)
RBC: 3.83 MIL/uL — ABNORMAL LOW (ref 3.87–5.11)
RDW: 13.5 % (ref 11.5–15.5)
WBC Count: 2.4 10*3/uL — ABNORMAL LOW (ref 4.0–10.5)
nRBC: 0 % (ref 0.0–0.2)

## 2020-01-20 LAB — FERRITIN: Ferritin: 13 ng/mL (ref 11–307)

## 2020-02-08 ENCOUNTER — Encounter: Payer: Self-pay | Admitting: Hematology

## 2020-02-26 ENCOUNTER — Inpatient Hospital Stay: Payer: BC Managed Care – PPO | Attending: Hematology

## 2020-02-26 ENCOUNTER — Other Ambulatory Visit: Payer: Self-pay

## 2020-02-26 VITALS — BP 128/75 | HR 54 | Temp 97.9°F | Resp 18

## 2020-02-26 DIAGNOSIS — Z79899 Other long term (current) drug therapy: Secondary | ICD-10-CM | POA: Diagnosis not present

## 2020-02-26 DIAGNOSIS — D500 Iron deficiency anemia secondary to blood loss (chronic): Secondary | ICD-10-CM | POA: Insufficient documentation

## 2020-02-26 DIAGNOSIS — N920 Excessive and frequent menstruation with regular cycle: Secondary | ICD-10-CM | POA: Diagnosis not present

## 2020-02-26 DIAGNOSIS — D72819 Decreased white blood cell count, unspecified: Secondary | ICD-10-CM | POA: Diagnosis not present

## 2020-02-26 MED ORDER — SODIUM CHLORIDE 0.9 % IV SOLN
400.0000 mg | Freq: Once | INTRAVENOUS | Status: AC
  Administered 2020-02-26: 400 mg via INTRAVENOUS
  Filled 2020-02-26: qty 20

## 2020-02-26 MED ORDER — SODIUM CHLORIDE 0.9 % IV SOLN
INTRAVENOUS | Status: DC
  Filled 2020-02-26: qty 250

## 2020-02-26 NOTE — Progress Notes (Signed)
Patient declines to wait 30 minutes post observation period following iron infusion. Vital signs taken and stable. Explained to patient what to do if she begins to experience reaction to iron infusion. Patient verbalized understanding. IV catheter removed and patient sent home.

## 2020-02-26 NOTE — Patient Instructions (Signed)

## 2020-03-04 ENCOUNTER — Inpatient Hospital Stay: Payer: BC Managed Care – PPO

## 2020-03-04 ENCOUNTER — Other Ambulatory Visit: Payer: 59

## 2020-04-13 ENCOUNTER — Other Ambulatory Visit: Payer: Self-pay

## 2020-04-13 DIAGNOSIS — D500 Iron deficiency anemia secondary to blood loss (chronic): Secondary | ICD-10-CM

## 2020-04-14 ENCOUNTER — Other Ambulatory Visit: Payer: Self-pay | Admitting: Hematology

## 2020-04-15 ENCOUNTER — Other Ambulatory Visit: Payer: Self-pay

## 2020-04-15 ENCOUNTER — Inpatient Hospital Stay: Payer: BC Managed Care – PPO | Attending: Hematology

## 2020-04-15 DIAGNOSIS — D500 Iron deficiency anemia secondary to blood loss (chronic): Secondary | ICD-10-CM | POA: Diagnosis not present

## 2020-04-15 LAB — CBC WITH DIFFERENTIAL (CANCER CENTER ONLY)
Abs Immature Granulocytes: 0 10*3/uL (ref 0.00–0.07)
Basophils Absolute: 0.1 10*3/uL (ref 0.0–0.1)
Basophils Relative: 2 %
Eosinophils Absolute: 0.1 10*3/uL (ref 0.0–0.5)
Eosinophils Relative: 2 %
HCT: 36.4 % (ref 36.0–46.0)
Hemoglobin: 11.9 g/dL — ABNORMAL LOW (ref 12.0–15.0)
Immature Granulocytes: 0 %
Lymphocytes Relative: 36 %
Lymphs Abs: 0.9 10*3/uL (ref 0.7–4.0)
MCH: 31.8 pg (ref 26.0–34.0)
MCHC: 32.7 g/dL (ref 30.0–36.0)
MCV: 97.3 fL (ref 80.0–100.0)
Monocytes Absolute: 0.2 10*3/uL (ref 0.1–1.0)
Monocytes Relative: 9 %
Neutro Abs: 1.3 10*3/uL — ABNORMAL LOW (ref 1.7–7.7)
Neutrophils Relative %: 51 %
Platelet Count: 272 10*3/uL (ref 150–400)
RBC: 3.74 MIL/uL — ABNORMAL LOW (ref 3.87–5.11)
RDW: 13.7 % (ref 11.5–15.5)
WBC Count: 2.6 10*3/uL — ABNORMAL LOW (ref 4.0–10.5)
nRBC: 0 % (ref 0.0–0.2)

## 2020-04-15 LAB — IRON AND TIBC
Iron: 67 ug/dL (ref 41–142)
Saturation Ratios: 27 % (ref 21–57)
TIBC: 251 ug/dL (ref 236–444)
UIBC: 184 ug/dL (ref 120–384)

## 2020-04-15 LAB — FERRITIN: Ferritin: 23 ng/mL (ref 11–307)

## 2020-04-27 IMAGING — MR MR PELVIS WO/W CM
7 of 13 series · 27 of 48 positions shown · IV contrast (20 mlMultihance)
Comparison: Pelvic ultrasound 11/29/2017. No other comparison
studies.

CLINICAL DATA: Pelvic pain with heavy menstrual bleeding. History
of uterine fibroids.

EXAM:
MRI PELVIS WITHOUT AND WITH CONTRAST
TECHNIQUE: Multiplanar multisequence MR imaging of the pelvis was performed
both before and after administration of intravenous contrast.
CONTRAST:  20mL MULTIHANCE GADOBENATE DIMEGLUMINE 529 MG/ML IV SOLN

[Series 4: t2_tse_sag · sagittal · 5.0mm · 0.59mm/px · 3 of 30 slices shown]
[im 1/30]
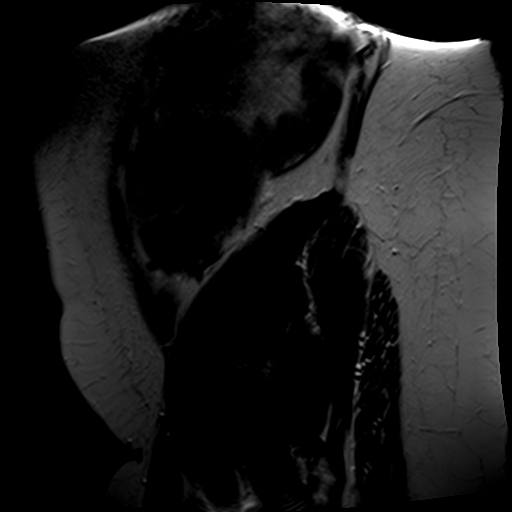
[im 15/30]
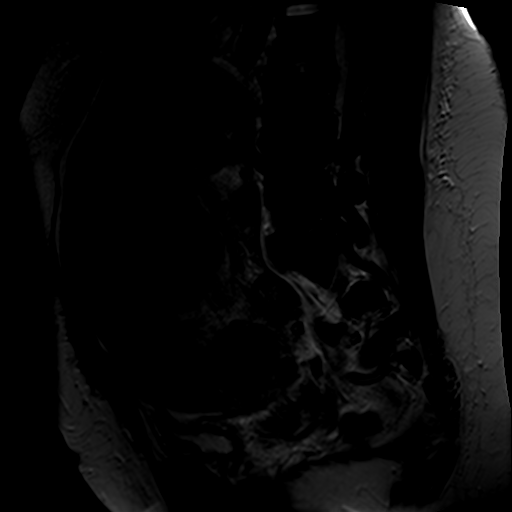
[im 30/30]
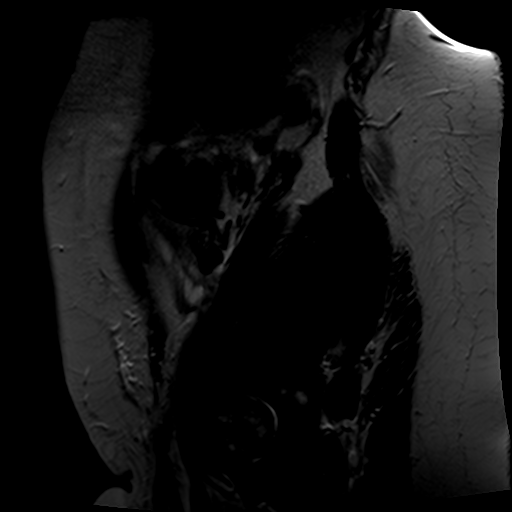

[Series 5: t2_tse axial · axial · 6.0mm · 0.51mm/px · z∈[-11,+234]mm · 4 of 35 slices shown]
[im 1/35]
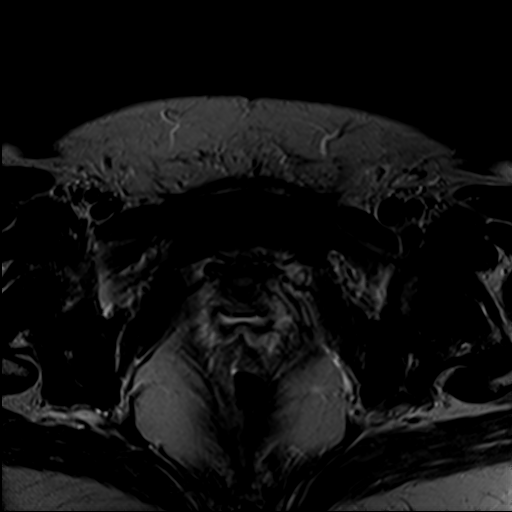
[im 12/35]
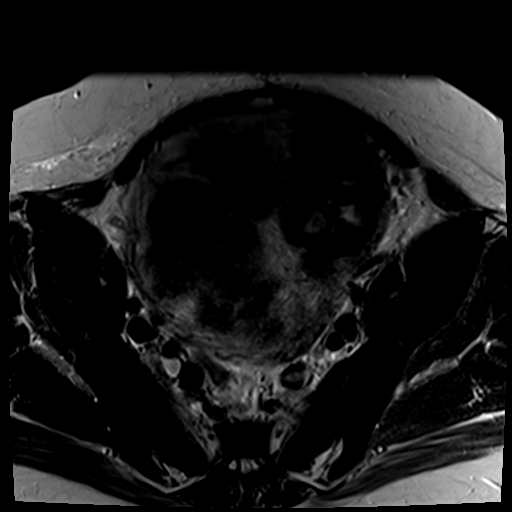
[im 23/35]
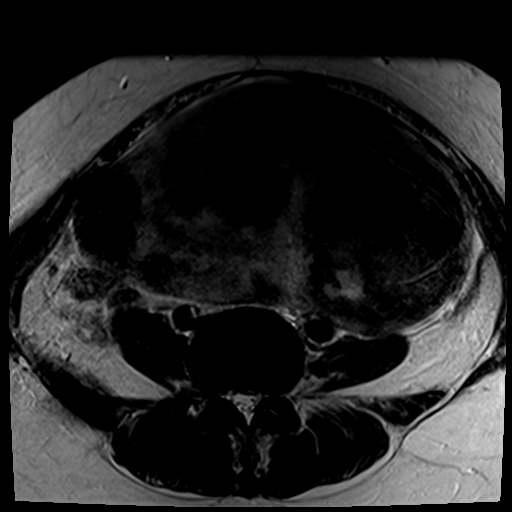
[im 35/35]
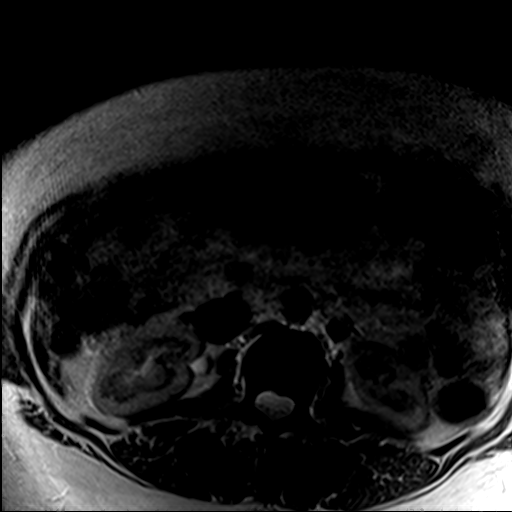

[Series 6: t2_tse axial fs · axial · 6.0mm · 0.51mm/px · z∈[-11,+234]mm · 4 of 35 slices shown]
[im 1/35]
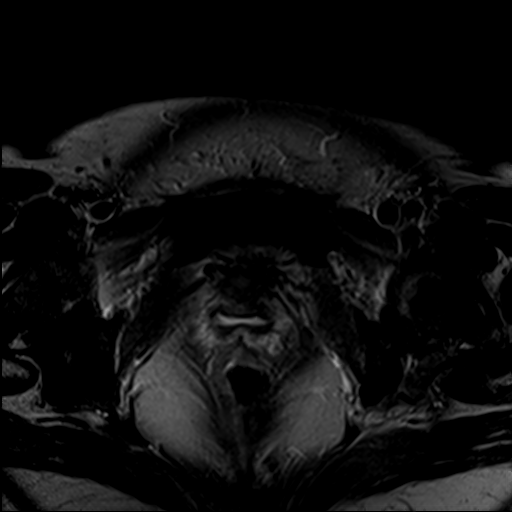
[im 12/35]
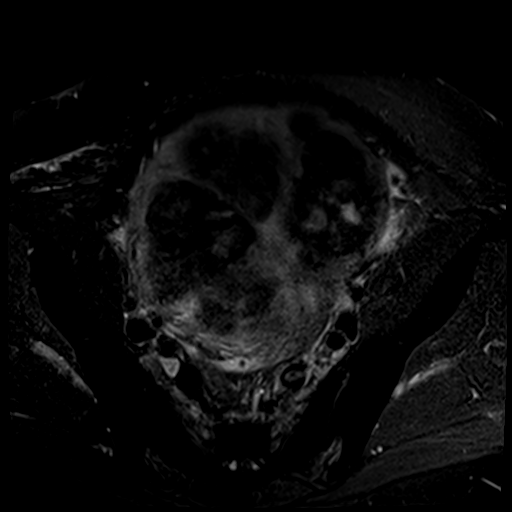
[im 23/35]
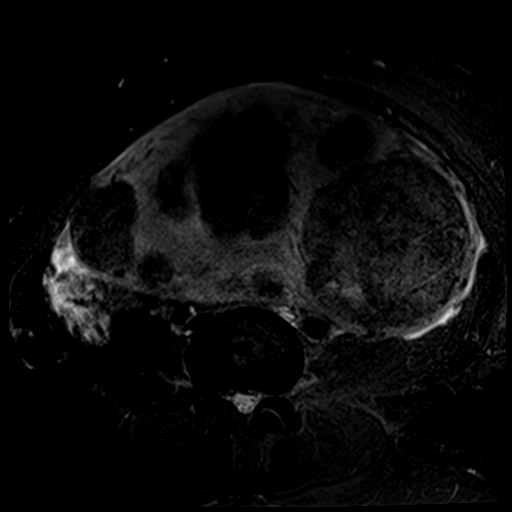
[im 35/35]
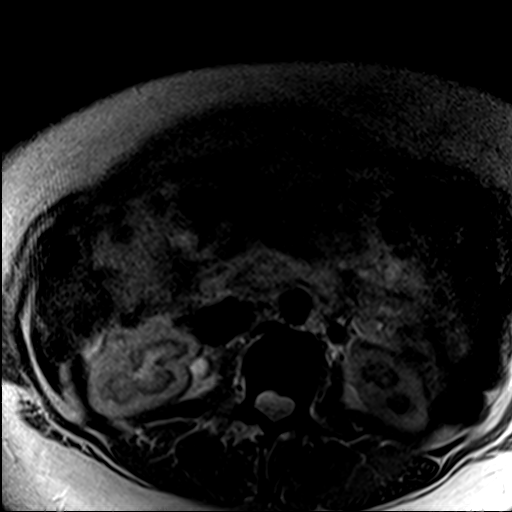

[Series 7: axial spgr no · axial · 6.0mm · 0.51mm/px · z∈[-11,+234]mm · 4 of 35 slices shown]
[im 1/35]
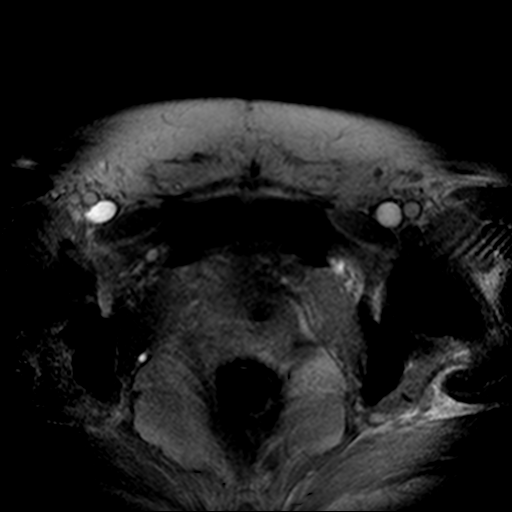
[im 12/35]
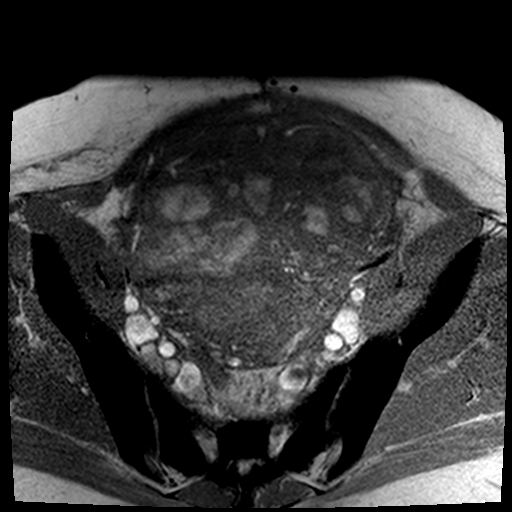
[im 23/35]
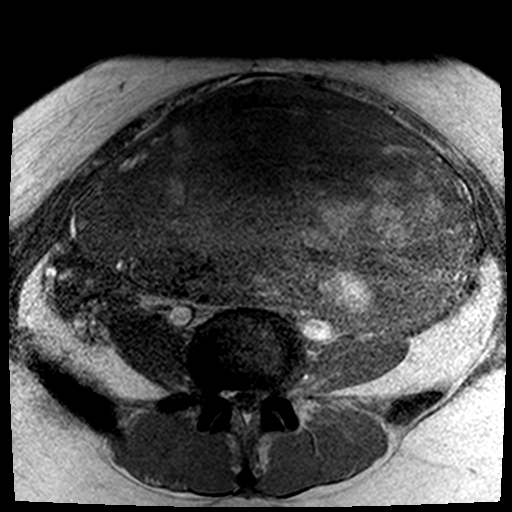
[im 35/35]
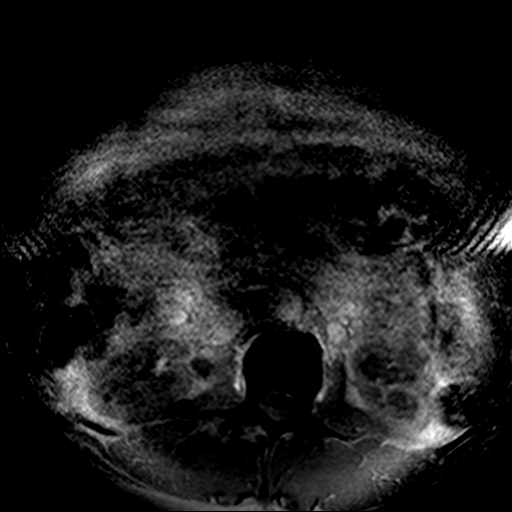

[Series 9: axial spgr fs · axial · non-contrast · 5.0mm · 1.17mm/px · z∈[+5,+218]mm · 4 of 35 slices shown]
[im 1/35]
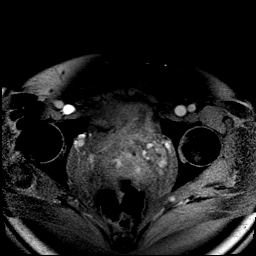
[im 12/35]
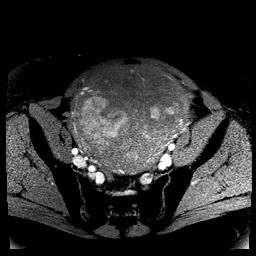
[im 23/35]
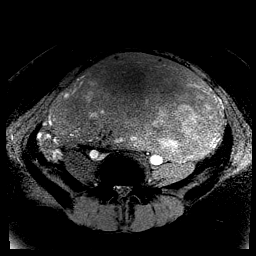
[im 35/35]
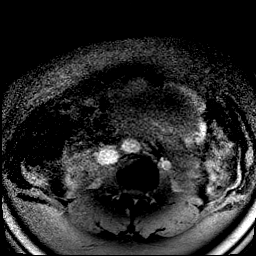

[Series 10: axial spgr post · axial · 5.0mm · 1.17mm/px · z∈[+5,+218]mm · 4 of 35 slices shown (1 of 2)]
[im 1/35]
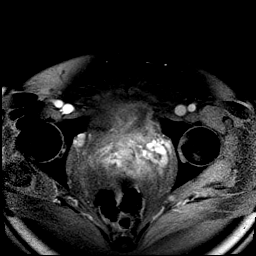
[im 12/35]
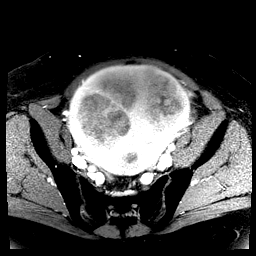
[im 23/35]
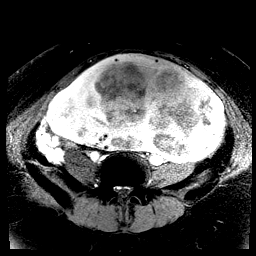
[im 35/35]
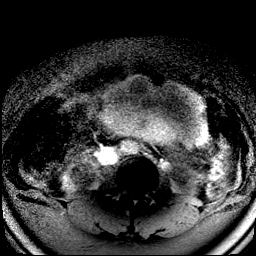

[Series 11: axial spgr post · axial · 5.0mm · 1.17mm/px · z∈[+5,+161]mm · 4 of 35 slices shown (2 of 2)]
[im 1/35]
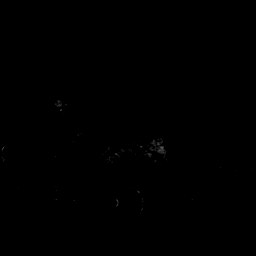
[im 9/35]
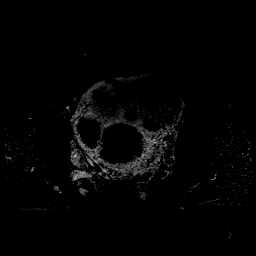
[im 18/35]
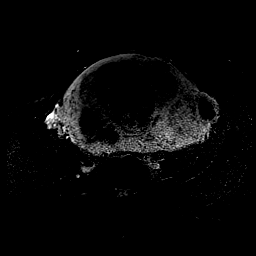
[im 26/35]
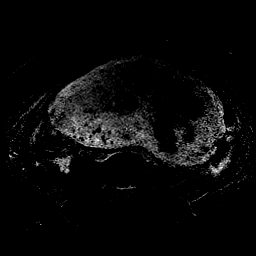

[27 of 48 positions shown; findings below may reference images not displayed]

FINDINGS: Urinary Tract: The visualized distal ureters and bladder appear
unremarkable.

Bowel: No bowel wall thickening, distention or surrounding
inflammation identified within the pelvis.

Vascular/Lymphatic: No enlarged pelvic lymph nodes identified. No
significant vascular findings.

Reproductive:

Uterus: Measures 24.8 x 12.0 x 20.9 cm. There are numerous
intramural and submucosal fibroids. The largest fibroid in the left
fundal region measures 12.3 x 8.7 x 10.1 cm and is mildly
heterogeneous with areas high T1 and T2 signal and heterogeneous
enhancement following contrast. Many of the other fibroids share
similar imaging characteristics. A few of the fibroids are
submucosal in location, although no significant protuberance into
the endometrial cavity is identified. There are no pedunculated
fibroids.

Endometrium: Distorted by the fibroids and displaced to the left in
the lower uterine segment. No significant endometrial thickening or
obvious focal lesion.

Cervix/Vagina: Small amount of fluid in the vagina. Otherwise
unremarkable.

Right ovary:  Appears normal.  No mass identified.

Left ovary:  Appears normal.  No mass identified.

Other: Small amount of free pelvic fluid.

Musculoskeletal: No acute or focal marrow lesions are identified.
There is decreased marrow signal throughout the visualized bones on
all pulse sequences. There is a small disc protrusion in the left
subarticular zone at L4-5. At L5-S1, there is a left foraminal disc
protrusion with possible left L5 nerve root encroachment.
IMPRESSION: 1. Marked uterine enlargement by multiple intramural and submucosal
fibroids. Many of the fibroids demonstrate degeneration, although no
dominant or pedunculated fibroids are seen.
2. Endometrial distortion without thickening or focal abnormality.
3. No adnexal mass.

## 2020-04-28 ENCOUNTER — Telehealth: Payer: Self-pay

## 2020-04-28 NOTE — Telephone Encounter (Signed)
-----   Message from Truitt Merle, MD sent at 04/25/2020  8:32 PM EST ----- Please let her know lab results, iron slightly low, her H/H is trending down, please set up iv venofer 400mg  once in next 1-2 weeks, thanks   Truitt Merle  04/25/2020

## 2020-04-28 NOTE — Telephone Encounter (Signed)
Spoke with Sierra Mejia made aware of most recent lab results and Dr. Burr Medico recommendation for IV venofer 400mg  x 1 dose Sierra Mejia agreeable scheduling message sent

## 2020-04-29 ENCOUNTER — Telehealth: Payer: Self-pay | Admitting: Hematology

## 2020-04-29 NOTE — Telephone Encounter (Signed)
Scheduled appointment per 03/08 schedule message. Attempted to contact patient, left a detailed message.

## 2020-05-07 ENCOUNTER — Telehealth: Payer: Self-pay | Admitting: Hematology

## 2020-05-07 NOTE — Telephone Encounter (Signed)
Cancelled 03/18 appointment per 03/16 scheduled message, called patient to reschedule but voicemail was left instead.

## 2020-05-08 ENCOUNTER — Ambulatory Visit: Payer: BC Managed Care – PPO

## 2020-07-06 ENCOUNTER — Telehealth: Payer: Self-pay | Admitting: Hematology

## 2020-07-06 NOTE — Telephone Encounter (Signed)
R/s appts per 5/16 sch msg. Pt aware.  

## 2020-07-08 ENCOUNTER — Inpatient Hospital Stay: Payer: BC Managed Care – PPO | Admitting: Hematology

## 2020-07-08 ENCOUNTER — Inpatient Hospital Stay: Payer: BC Managed Care – PPO

## 2020-07-08 NOTE — Progress Notes (Signed)
[facility]   [phone] Fax:(336) [phone]   Clinic Follow up Note   Patient Care Team: Waymon Amato, MD as PCP - General (Obstetrics and Gynecology)  Date of Service:  07/10/2020  CHIEF COMPLAINT: F/u ofanemia  CURRENT THERAPY:  -Oral iron 1-2 times a day (not consistently due to tolerance)  -IV Feraheme as needed for ferritin <20-30 since 07/2016. Switched to IV Venofer in 01/2019. Increase Venofer dose to 400mg  from 12/06/19 and again from 09/13/19.   INTERVAL HISTORY:  Sierra Mejia is here for a follow up of anemia. She was last seen by me 7 months ago. She presents to the clinic alone. She notes her periods are normal and not heavy like before. She notes she has never had mammogram but does breast exams. She denies family history of breast cancer. She will consider mammograms.    REVIEW OF SYSTEMS:   Constitutional: Denies fevers, chills or abnormal weight loss Eyes: Denies blurriness of vision Ears, nose, mouth, throat, and face: Denies mucositis or sore throat Respiratory: Denies cough, dyspnea or wheezes Cardiovascular: Denies palpitation, chest discomfort or lower extremity swelling Gastrointestinal:  Denies nausea, heartburn or change in bowel habits Skin: Denies abnormal skin rashes Lymphatics: Denies new lymphadenopathy or easy bruising Neurological:Denies numbness, tingling or new weaknesses Behavioral/Psych: Mood is stable, no new changes  All other systems were reviewed with the patient and are negative.  MEDICAL HISTORY:  Past Medical History:  Diagnosis Date  . Anemia   . Fibroids   . Wears contact lenses     SURGICAL HISTORY: Past Surgical History:  Procedure Laterality Date  . IR ANGIOGRAM PELVIS SELECTIVE OR SUPRASELECTIVE  02/05/2018  . IR ANGIOGRAM PELVIS SELECTIVE OR SUPRASELECTIVE  02/05/2018  . IR ANGIOGRAM SELECTIVE EACH ADDITIONAL VESSEL  02/05/2018  . IR ANGIOGRAM SELECTIVE EACH ADDITIONAL VESSEL  02/05/2018  .  IR EMBO TUMOR ORGAN ISCHEMIA INFARCT INC GUIDE ROADMAPPING  02/05/2018  . IR RADIOLOGIST EVAL & MGMT  12/14/2017  . IR RADIOLOGIST EVAL & MGMT  03/21/2018  . IR RADIOLOGIST EVAL & MGMT  09/19/2018  . IR US GUIDE VASC ACCESS RIGHT  02/05/2018  . MYOMECTOMY    . ORIF ANKLE FRACTURE Left 02/25/2015   Procedure: OPEN REDUCTION INTERNAL FIXATION (ORIF) ANKLE FRACTURE LEFT FIBULAR;  Surgeon: Samara Deist, DPM;  Location: [facility];  Service: Podiatry;  Laterality: Left;  WITH POPLITEAL    I have reviewed the social history and family history with the patient and they are unchanged from previous note.  ALLERGIES:  has No Known Allergies.  MEDICATIONS:  Current Outpatient Medications  Medication Sig Dispense Refill  . Ferrous Sulfate (IRON) 325 (65 Fe) MG TABS Take 1 tablet (325 mg total) by mouth 3 (three) times daily with meals. 90 tablet 3   No current facility-administered medications for this visit.    PHYSICAL EXAMINATION: ECOG PERFORMANCE STATUS: 0 - Asymptomatic  Vitals:   07/10/20 1107  BP: 133/84  Pulse: 66  Resp: 18  Temp: 98.1 F (36.7 C)  SpO2: 100%   Filed Weights   07/10/20 1107  Weight: 226 lb 1.6 oz (102.6 kg)     Due to COVID19 we will limit examination to appearance. Patient had no complaints.  GENERAL:alert, no distress and comfortable SKIN: skin color normal, no rashes or significant lesions EYES: normal, Conjunctiva are pink and non-injected, sclera clear  NEURO: alert & oriented x 3 with fluent speech   LABORATORY DATA:  I have reviewed the data  as listed CBC Latest Ref Rng & Units 07/10/2020 04/15/2020 01/20/2020  WBC 4.0 - 10.5 K/uL 2.4(L) 2.6(L) 2.4(L)  Hemoglobin 12.0 - 15.0 g/dL 11.5(L) 11.9(L) 12.1  Hematocrit 36.0 - 46.0 % 35.1(L) 36.4 37.1  Platelets 150 - 400 K/uL 220 272 238     CMP Latest Ref Rng & Units 02/05/2018 11/29/2017  Glucose 70 - 99 mg/dL 79 79  BUN 6 - 20 mg/dL 12 12  Creatinine 0.44 - 1.00 mg/dL 0.65 0.67  Sodium  135 - 145 mmol/L 139 144  Potassium 3.5 - 5.1 mmol/L 4.0 3.9  Chloride 98 - 111 mmol/L 108 111  CO2 22 - 32 mmol/L 23 25  Calcium 8.9 - 10.3 mg/dL 8.4(L) 8.8(L)  Total Protein 6.5 - 8.1 g/dL - 6.3(L)  Total Bilirubin 0.3 - 1.2 mg/dL - 0.7  Alkaline Phos 38 - 126 U/L - 56  AST 15 - 41 U/L - 20  ALT 0 - 44 U/L - 16      RADIOGRAPHIC STUDIES: I have personally reviewed the radiological images as listed and agreed with the findings in the report. No results found.   ASSESSMENT & PLAN:  Sierra Mejia is a 40 y.o. female with   1.Iron deficient anemia, secondary tomenorrhagia -She haschronic anemia and iron deficiency, previouslyrequired blood transfusion once and IVironin 2018 and 2019.  -She had a negative stool OB in 09/2017.  -She has menorrhagia from fibroid. This is likely the cause of her IDA.She has tried birth control in the past without much help. She had 3 months Lupron injection in 12/2017 and endometrial ablation in 01/2018. Her period did lighten up but has increased again lately.  -She is currently on oral iron 1-2 times a day. She is also on IV Feraheme for ferritin<30 as needed since 07/2016. Switched to IV Venofer in 01/2019. Dose increased to 400mg  on 09/13/19 due to less frequent infusions. Her last dose was 02/26/20.  -She is clinically doing well. Her energy is adequate and recent periods normal. Labs reviewed, Hg at 11.5 today. Her hg has been stable in 11 range lately. Ferritin is still pending.   -Per patient request for less IV iron, I would recommend IV iron with Ferritin 20 or below to avoid worsening anemia. Continue oral iron. She is on 2-3 times daily.  -Given stable disease will monitor with labs every 3 months. F/u in 9 months.   2. Mild Leukopenia -With mild neutropenia. We did consider the possibility of immune related neutropenia, or mild leukopenia secondary to her ethnicity. Has been chronically low since 2019.  -Remains mild and stable. Will  continuing observation, no indication of treatment at this time.    3. Health Maintenance  -I recommend she proceed with age appropriate cancer screenings, including mammograms. She will consider.    PLAN: -Continue oral iron  -Lab every 3 months. We will set up IV Venofer 400mg  if ferritin less than 20, two doses if ferrin<10 -F/u in 9 months    No problem-specific Assessment & Plan notes found for this encounter.   No orders of the defined types were placed in this encounter.  All questions were answered. The patient knows to call the clinic with any problems, questions or concerns. No barriers to learning was detected. The total time spent in the appointment was 25 minutes.     Truitt Merle, MD 07/10/2020   I, Joslyn Devon, am acting as scribe for Truitt Merle, MD.   I have reviewed the above documentation  for accuracy and completeness, and I agree with the above.

## 2020-07-10 ENCOUNTER — Other Ambulatory Visit: Payer: Self-pay

## 2020-07-10 ENCOUNTER — Inpatient Hospital Stay: Payer: BC Managed Care – PPO | Attending: Hematology

## 2020-07-10 ENCOUNTER — Inpatient Hospital Stay (HOSPITAL_BASED_OUTPATIENT_CLINIC_OR_DEPARTMENT_OTHER): Payer: BC Managed Care – PPO | Admitting: Hematology

## 2020-07-10 ENCOUNTER — Encounter: Payer: Self-pay | Admitting: Hematology

## 2020-07-10 VITALS — BP 133/84 | HR 66 | Temp 98.1°F | Resp 18 | Wt 226.1 lb

## 2020-07-10 DIAGNOSIS — Z803 Family history of malignant neoplasm of breast: Secondary | ICD-10-CM | POA: Diagnosis not present

## 2020-07-10 DIAGNOSIS — D500 Iron deficiency anemia secondary to blood loss (chronic): Secondary | ICD-10-CM

## 2020-07-10 DIAGNOSIS — Z79899 Other long term (current) drug therapy: Secondary | ICD-10-CM | POA: Diagnosis not present

## 2020-07-10 DIAGNOSIS — D72819 Decreased white blood cell count, unspecified: Secondary | ICD-10-CM | POA: Diagnosis not present

## 2020-07-10 DIAGNOSIS — N920 Excessive and frequent menstruation with regular cycle: Secondary | ICD-10-CM | POA: Insufficient documentation

## 2020-07-10 LAB — CBC WITH DIFFERENTIAL (CANCER CENTER ONLY)
Abs Immature Granulocytes: 0 10*3/uL (ref 0.00–0.07)
Basophils Absolute: 0.1 10*3/uL (ref 0.0–0.1)
Basophils Relative: 2 %
Eosinophils Absolute: 0 10*3/uL (ref 0.0–0.5)
Eosinophils Relative: 1 %
HCT: 35.1 % — ABNORMAL LOW (ref 36.0–46.0)
Hemoglobin: 11.5 g/dL — ABNORMAL LOW (ref 12.0–15.0)
Immature Granulocytes: 0 %
Lymphocytes Relative: 37 %
Lymphs Abs: 0.9 10*3/uL (ref 0.7–4.0)
MCH: 31.4 pg (ref 26.0–34.0)
MCHC: 32.8 g/dL (ref 30.0–36.0)
MCV: 95.9 fL (ref 80.0–100.0)
Monocytes Absolute: 0.3 10*3/uL (ref 0.1–1.0)
Monocytes Relative: 14 %
Neutro Abs: 1.1 10*3/uL — ABNORMAL LOW (ref 1.7–7.7)
Neutrophils Relative %: 46 %
Platelet Count: 220 10*3/uL (ref 150–400)
RBC: 3.66 MIL/uL — ABNORMAL LOW (ref 3.87–5.11)
RDW: 13.5 % (ref 11.5–15.5)
WBC Count: 2.4 10*3/uL — ABNORMAL LOW (ref 4.0–10.5)
nRBC: 0 % (ref 0.0–0.2)

## 2020-07-10 LAB — IRON AND TIBC
Iron: 148 ug/dL — ABNORMAL HIGH (ref 41–142)
Saturation Ratios: 55 % (ref 21–57)
TIBC: 271 ug/dL (ref 236–444)
UIBC: 123 ug/dL (ref 120–384)

## 2020-07-10 LAB — FERRITIN: Ferritin: 12 ng/mL (ref 11–307)

## 2020-07-13 ENCOUNTER — Telehealth: Payer: Self-pay

## 2020-07-13 NOTE — Telephone Encounter (Signed)
Sierra Mejia has declined iron infusion at this time.  I explained her ferritin level and dr Ernestina Penna recommendations for an additional dose.  She states she wants to try to bring it up without IV infusion.

## 2020-07-14 ENCOUNTER — Telehealth: Payer: Self-pay

## 2020-07-14 NOTE — Telephone Encounter (Signed)
This nurse spoke with patient and informed her of iron levels being low.  She states that scheduling has already reached out to her to schedule the Venofer and she said that she would like to wait on the infusion and see if she can get her iron levels up on her own.  Then have the levels rechecked with her next labs.  This nurse stated that she will inform MD of her wishes,  No further questions or concerns at this time.

## 2020-07-14 NOTE — Telephone Encounter (Signed)
-----   Message from Truitt Merle, MD sent at 07/10/2020  5:32 PM EDT ----- Please let pt know her iron level is slightly low, I recommend iv venofer 400mg  once in next 2-3 weeks, I have sent LOS for that, thanks   Truitt Merle  07/10/2020

## 2020-10-07 ENCOUNTER — Other Ambulatory Visit: Payer: Self-pay

## 2020-10-07 ENCOUNTER — Inpatient Hospital Stay: Payer: BC Managed Care – PPO | Attending: Hematology

## 2020-10-07 DIAGNOSIS — D500 Iron deficiency anemia secondary to blood loss (chronic): Secondary | ICD-10-CM | POA: Diagnosis present

## 2020-10-07 DIAGNOSIS — N920 Excessive and frequent menstruation with regular cycle: Secondary | ICD-10-CM | POA: Insufficient documentation

## 2020-10-07 LAB — IRON AND TIBC
Iron: 13 ug/dL — ABNORMAL LOW (ref 41–142)
Saturation Ratios: 4 % — ABNORMAL LOW (ref 21–57)
TIBC: 318 ug/dL (ref 236–444)
UIBC: 305 ug/dL (ref 120–384)

## 2020-10-07 LAB — FERRITIN: Ferritin: <4 ng/mL — ABNORMAL LOW (ref 11–307)

## 2020-10-07 LAB — CBC WITH DIFFERENTIAL (CANCER CENTER ONLY)
Abs Immature Granulocytes: 0 10*3/uL (ref 0.00–0.07)
Basophils Absolute: 0 10*3/uL (ref 0.0–0.1)
Basophils Relative: 2 %
Eosinophils Absolute: 0 10*3/uL (ref 0.0–0.5)
Eosinophils Relative: 2 %
HCT: 27 % — ABNORMAL LOW (ref 36.0–46.0)
Hemoglobin: 8.6 g/dL — ABNORMAL LOW (ref 12.0–15.0)
Immature Granulocytes: 0 %
Lymphocytes Relative: 39 %
Lymphs Abs: 0.8 10*3/uL (ref 0.7–4.0)
MCH: 28.2 pg (ref 26.0–34.0)
MCHC: 31.9 g/dL (ref 30.0–36.0)
MCV: 88.5 fL (ref 80.0–100.0)
Monocytes Absolute: 0.2 10*3/uL (ref 0.1–1.0)
Monocytes Relative: 8 %
Neutro Abs: 1.1 10*3/uL — ABNORMAL LOW (ref 1.7–7.7)
Neutrophils Relative %: 49 %
Platelet Count: 324 10*3/uL (ref 150–400)
RBC: 3.05 MIL/uL — ABNORMAL LOW (ref 3.87–5.11)
RDW: 13.9 % (ref 11.5–15.5)
WBC Count: 2.2 10*3/uL — ABNORMAL LOW (ref 4.0–10.5)
nRBC: 0 % (ref 0.0–0.2)

## 2020-10-21 ENCOUNTER — Other Ambulatory Visit: Payer: BC Managed Care – PPO

## 2020-10-23 ENCOUNTER — Other Ambulatory Visit: Payer: Self-pay

## 2020-10-23 ENCOUNTER — Inpatient Hospital Stay: Payer: BC Managed Care – PPO | Attending: Hematology

## 2020-10-23 DIAGNOSIS — D72819 Decreased white blood cell count, unspecified: Secondary | ICD-10-CM | POA: Diagnosis not present

## 2020-10-23 DIAGNOSIS — D500 Iron deficiency anemia secondary to blood loss (chronic): Secondary | ICD-10-CM

## 2020-10-23 DIAGNOSIS — Z79899 Other long term (current) drug therapy: Secondary | ICD-10-CM | POA: Insufficient documentation

## 2020-10-23 DIAGNOSIS — N920 Excessive and frequent menstruation with regular cycle: Secondary | ICD-10-CM | POA: Diagnosis not present

## 2020-10-23 LAB — CBC WITH DIFFERENTIAL (CANCER CENTER ONLY)
Abs Immature Granulocytes: 0 10*3/uL (ref 0.00–0.07)
Basophils Absolute: 0.1 10*3/uL (ref 0.0–0.1)
Basophils Relative: 3 %
Eosinophils Absolute: 0 10*3/uL (ref 0.0–0.5)
Eosinophils Relative: 1 %
HCT: 27.6 % — ABNORMAL LOW (ref 36.0–46.0)
Hemoglobin: 8.6 g/dL — ABNORMAL LOW (ref 12.0–15.0)
Immature Granulocytes: 0 %
Lymphocytes Relative: 46 %
Lymphs Abs: 1 10*3/uL (ref 0.7–4.0)
MCH: 27.3 pg (ref 26.0–34.0)
MCHC: 31.2 g/dL (ref 30.0–36.0)
MCV: 87.6 fL (ref 80.0–100.0)
Monocytes Absolute: 0.2 10*3/uL (ref 0.1–1.0)
Monocytes Relative: 9 %
Neutro Abs: 0.9 10*3/uL — ABNORMAL LOW (ref 1.7–7.7)
Neutrophils Relative %: 41 %
Platelet Count: 348 10*3/uL (ref 150–400)
RBC: 3.15 MIL/uL — ABNORMAL LOW (ref 3.87–5.11)
RDW: 15 % (ref 11.5–15.5)
WBC Count: 2.2 10*3/uL — ABNORMAL LOW (ref 4.0–10.5)
nRBC: 0 % (ref 0.0–0.2)

## 2020-10-23 LAB — IRON AND TIBC
Iron: 81 ug/dL (ref 41–142)
Saturation Ratios: 26 % (ref 21–57)
TIBC: 313 ug/dL (ref 236–444)
UIBC: 232 ug/dL (ref 120–384)

## 2020-10-23 LAB — FERRITIN: Ferritin: <4 ng/mL — ABNORMAL LOW (ref 11–307)

## 2020-10-24 ENCOUNTER — Telehealth: Payer: Self-pay | Admitting: Hematology

## 2020-10-24 NOTE — Telephone Encounter (Signed)
I called pt and informed her lab results, her iron level is very low and her anemia is worse. I will schedule her iv venofer '400mg'$  weeklyX3. She feels well, will be out of town the coming week, will come in for iv iron the week of 9/12. I suggest her to avoid heavy exertion, she voiced good understanding and agrees with the plan.   Truitt Merle  10/24/2020

## 2020-10-27 ENCOUNTER — Telehealth: Payer: Self-pay | Admitting: Hematology

## 2020-10-27 NOTE — Telephone Encounter (Signed)
Scheduled per sch msg. Called and left msg. Advised that next weeks appts will be scheduled upon approval. Will call back

## 2020-10-30 ENCOUNTER — Encounter: Payer: Self-pay | Admitting: Hematology

## 2020-11-02 ENCOUNTER — Inpatient Hospital Stay: Payer: BC Managed Care – PPO

## 2020-11-09 ENCOUNTER — Ambulatory Visit: Payer: BC Managed Care – PPO

## 2020-11-16 ENCOUNTER — Ambulatory Visit: Payer: BC Managed Care – PPO

## 2020-11-17 ENCOUNTER — Other Ambulatory Visit: Payer: Self-pay

## 2020-11-17 ENCOUNTER — Inpatient Hospital Stay: Payer: BC Managed Care – PPO

## 2020-11-17 DIAGNOSIS — D500 Iron deficiency anemia secondary to blood loss (chronic): Secondary | ICD-10-CM | POA: Diagnosis not present

## 2020-11-17 LAB — CBC WITH DIFFERENTIAL (CANCER CENTER ONLY)
Abs Immature Granulocytes: 0 10*3/uL (ref 0.00–0.07)
Basophils Absolute: 0 10*3/uL (ref 0.0–0.1)
Basophils Relative: 2 %
Eosinophils Absolute: 0 10*3/uL (ref 0.0–0.5)
Eosinophils Relative: 1 %
HCT: 27.7 % — ABNORMAL LOW (ref 36.0–46.0)
Hemoglobin: 8.7 g/dL — ABNORMAL LOW (ref 12.0–15.0)
Immature Granulocytes: 0 %
Lymphocytes Relative: 41 %
Lymphs Abs: 0.8 10*3/uL (ref 0.7–4.0)
MCH: 26.5 pg (ref 26.0–34.0)
MCHC: 31.4 g/dL (ref 30.0–36.0)
MCV: 84.5 fL (ref 80.0–100.0)
Monocytes Absolute: 0.2 10*3/uL (ref 0.1–1.0)
Monocytes Relative: 9 %
Neutro Abs: 1 10*3/uL — ABNORMAL LOW (ref 1.7–7.7)
Neutrophils Relative %: 47 %
Platelet Count: 361 10*3/uL (ref 150–400)
RBC: 3.28 MIL/uL — ABNORMAL LOW (ref 3.87–5.11)
RDW: 16.6 % — ABNORMAL HIGH (ref 11.5–15.5)
WBC Count: 2.1 10*3/uL — ABNORMAL LOW (ref 4.0–10.5)
nRBC: 0 % (ref 0.0–0.2)

## 2020-11-17 LAB — IRON AND TIBC
Iron: 15 ug/dL — ABNORMAL LOW (ref 41–142)
Saturation Ratios: 5 % — ABNORMAL LOW (ref 21–57)
TIBC: 309 ug/dL (ref 236–444)
UIBC: 294 ug/dL (ref 120–384)

## 2020-11-17 LAB — FERRITIN: Ferritin: 4 ng/mL — ABNORMAL LOW (ref 11–307)

## 2020-11-18 ENCOUNTER — Other Ambulatory Visit: Payer: Self-pay

## 2020-11-18 ENCOUNTER — Inpatient Hospital Stay: Payer: BC Managed Care – PPO

## 2020-11-18 VITALS — BP 124/80 | HR 66 | Temp 98.5°F | Resp 18

## 2020-11-18 DIAGNOSIS — D500 Iron deficiency anemia secondary to blood loss (chronic): Secondary | ICD-10-CM

## 2020-11-18 MED ORDER — SODIUM CHLORIDE 0.9 % IV SOLN: INTRAVENOUS | Status: DC

## 2020-11-18 MED ORDER — SODIUM CHLORIDE 0.9 % IV SOLN
400.0000 mg | Freq: Once | INTRAVENOUS | Status: AC
  Administered 2020-11-18: 400 mg via INTRAVENOUS
  Filled 2020-11-18: qty 20

## 2020-11-18 NOTE — Patient Instructions (Signed)

## 2020-11-23 ENCOUNTER — Telehealth: Payer: Self-pay | Admitting: Hematology

## 2020-11-23 NOTE — Telephone Encounter (Signed)
Scheduled per 10/3 sch msg, pt was called and confirmed phone appt

## 2020-11-25 ENCOUNTER — Inpatient Hospital Stay: Payer: BC Managed Care – PPO | Attending: Hematology

## 2020-11-25 ENCOUNTER — Other Ambulatory Visit: Payer: Self-pay

## 2020-11-25 VITALS — BP 122/78 | HR 62 | Temp 98.2°F | Resp 18

## 2020-11-25 DIAGNOSIS — Z79899 Other long term (current) drug therapy: Secondary | ICD-10-CM | POA: Insufficient documentation

## 2020-11-25 DIAGNOSIS — D72819 Decreased white blood cell count, unspecified: Secondary | ICD-10-CM | POA: Insufficient documentation

## 2020-11-25 DIAGNOSIS — N920 Excessive and frequent menstruation with regular cycle: Secondary | ICD-10-CM | POA: Insufficient documentation

## 2020-11-25 DIAGNOSIS — D500 Iron deficiency anemia secondary to blood loss (chronic): Secondary | ICD-10-CM | POA: Insufficient documentation

## 2020-11-25 MED ORDER — SODIUM CHLORIDE 0.9 % IV SOLN
400.0000 mg | Freq: Once | INTRAVENOUS | Status: AC
  Administered 2020-11-25: 400 mg via INTRAVENOUS
  Filled 2020-11-25: qty 20

## 2020-12-02 ENCOUNTER — Inpatient Hospital Stay: Payer: BC Managed Care – PPO

## 2020-12-02 ENCOUNTER — Other Ambulatory Visit: Payer: Self-pay

## 2020-12-02 VITALS — BP 131/78 | HR 67 | Temp 98.4°F | Resp 18

## 2020-12-02 DIAGNOSIS — D500 Iron deficiency anemia secondary to blood loss (chronic): Secondary | ICD-10-CM

## 2020-12-02 MED ORDER — SODIUM CHLORIDE 0.9 % IV SOLN
400.0000 mg | Freq: Once | INTRAVENOUS | Status: AC
  Administered 2020-12-02: 400 mg via INTRAVENOUS
  Filled 2020-12-02: qty 20

## 2020-12-02 MED ORDER — SODIUM CHLORIDE 0.9 % IV SOLN: Freq: Once | INTRAVENOUS | Status: AC

## 2020-12-02 NOTE — Patient Instructions (Signed)

## 2021-01-06 ENCOUNTER — Inpatient Hospital Stay: Payer: BC Managed Care – PPO | Attending: Hematology

## 2021-01-08 ENCOUNTER — Inpatient Hospital Stay: Payer: BC Managed Care – PPO | Admitting: Hematology

## 2021-01-22 ENCOUNTER — Inpatient Hospital Stay: Payer: Managed Care, Other (non HMO) | Attending: Hematology

## 2021-01-22 DIAGNOSIS — D72819 Decreased white blood cell count, unspecified: Secondary | ICD-10-CM | POA: Insufficient documentation

## 2021-01-22 DIAGNOSIS — N920 Excessive and frequent menstruation with regular cycle: Secondary | ICD-10-CM | POA: Insufficient documentation

## 2021-01-22 DIAGNOSIS — Z79899 Other long term (current) drug therapy: Secondary | ICD-10-CM | POA: Insufficient documentation

## 2021-01-22 DIAGNOSIS — D500 Iron deficiency anemia secondary to blood loss (chronic): Secondary | ICD-10-CM | POA: Insufficient documentation

## 2021-01-25 ENCOUNTER — Inpatient Hospital Stay: Payer: Managed Care, Other (non HMO) | Admitting: Hematology

## 2021-01-26 ENCOUNTER — Telehealth: Payer: Self-pay | Admitting: Hematology

## 2021-01-26 NOTE — Telephone Encounter (Signed)
Scheduled per sch msg. Called and left msg  

## 2021-02-04 ENCOUNTER — Encounter: Payer: Self-pay | Admitting: Nurse Practitioner

## 2021-02-04 ENCOUNTER — Encounter: Payer: Self-pay | Admitting: Hematology

## 2021-02-04 ENCOUNTER — Other Ambulatory Visit: Payer: Self-pay

## 2021-02-04 ENCOUNTER — Inpatient Hospital Stay: Payer: Managed Care, Other (non HMO) | Admitting: Hematology

## 2021-02-04 ENCOUNTER — Inpatient Hospital Stay: Payer: Managed Care, Other (non HMO)

## 2021-02-04 VITALS — BP 135/92 | HR 65 | Temp 98.4°F | Resp 16 | Ht 69.0 in | Wt 231.4 lb

## 2021-02-04 DIAGNOSIS — D72819 Decreased white blood cell count, unspecified: Secondary | ICD-10-CM | POA: Diagnosis not present

## 2021-02-04 DIAGNOSIS — Z79899 Other long term (current) drug therapy: Secondary | ICD-10-CM | POA: Diagnosis not present

## 2021-02-04 DIAGNOSIS — N920 Excessive and frequent menstruation with regular cycle: Secondary | ICD-10-CM | POA: Diagnosis not present

## 2021-02-04 DIAGNOSIS — D500 Iron deficiency anemia secondary to blood loss (chronic): Secondary | ICD-10-CM

## 2021-02-04 LAB — CBC WITH DIFFERENTIAL (CANCER CENTER ONLY)
Abs Immature Granulocytes: 0 10*3/uL (ref 0.00–0.07)
Basophils Absolute: 0.1 10*3/uL (ref 0.0–0.1)
Basophils Relative: 2 %
Eosinophils Absolute: 0 10*3/uL (ref 0.0–0.5)
Eosinophils Relative: 1 %
HCT: 35.2 % — ABNORMAL LOW (ref 36.0–46.0)
Hemoglobin: 11.1 g/dL — ABNORMAL LOW (ref 12.0–15.0)
Immature Granulocytes: 0 %
Lymphocytes Relative: 45 %
Lymphs Abs: 0.9 10*3/uL (ref 0.7–4.0)
MCH: 29.3 pg (ref 26.0–34.0)
MCHC: 31.5 g/dL (ref 30.0–36.0)
MCV: 92.9 fL (ref 80.0–100.0)
Monocytes Absolute: 0.2 10*3/uL (ref 0.1–1.0)
Monocytes Relative: 10 %
Neutro Abs: 0.9 10*3/uL — ABNORMAL LOW (ref 1.7–7.7)
Neutrophils Relative %: 42 %
Platelet Count: 277 10*3/uL (ref 150–400)
RBC: 3.79 MIL/uL — ABNORMAL LOW (ref 3.87–5.11)
RDW: 16.5 % — ABNORMAL HIGH (ref 11.5–15.5)
WBC Count: 2.1 10*3/uL — ABNORMAL LOW (ref 4.0–10.5)
nRBC: 0 % (ref 0.0–0.2)

## 2021-02-04 LAB — IRON AND TIBC
Iron: 31 ug/dL — ABNORMAL LOW (ref 41–142)
Saturation Ratios: 11 % — ABNORMAL LOW (ref 21–57)
TIBC: 291 ug/dL (ref 236–444)
UIBC: 260 ug/dL (ref 120–384)

## 2021-02-04 LAB — FERRITIN: Ferritin: 8 ng/mL — ABNORMAL LOW (ref 11–307)

## 2021-02-04 NOTE — Progress Notes (Signed)
[facility]   Telephone:(336) [phone] Fax:(336) [phone]   Clinic Follow up Note   Patient Care Team: Waymon Amato, MD as PCP - General (Obstetrics and Gynecology)  Date of Service:  02/04/2021  CHIEF COMPLAINT: f/u of anemia  CURRENT THERAPY:  -Oral iron 1-2 times a day (not consistently due to tolerance)  -IV Feraheme as needed for ferritin <20-30 since 07/2016. Switched to IV Venofer in 01/2019.   ASSESSMENT & PLAN:  Sierra Mejia is a 40 y.o. female with   1. Iron deficient anemia, secondary to menorrhagia -She has chronic anemia and iron deficiency, previously required blood transfusion once and IV iron in 2018 and 2019.  -She had a negative stool OB in 09/2017.  -She has menorrhagia from fibroid. This is likely the cause of her IDA. She has tried birth control in the past without much help. She had 3 months Lupron injection in 12/2017 and endometrial ablation in 01/2018. Her period initially lightened. She has not had a pelvic scan since 02/2019, and she asked about this today. I recommended she f/u with her GYN, who monitors this. -She is currently on oral iron 1-2 times a day. She is also on IV Feraheme for ferritin<30 as needed since 07/2016. Switched to IV Venofer in 01/2019. Dose increased to 400mg  on 09/13/19 due to less frequent infusions. Her last dose was 12/02/20.  -She is clinically doing well. Her energy is adequate and recent periods normal. Labs reviewed, Hg at 11.1 today, likely related to her recent IV iron. (Prior was in [place]) Ferritin is still pending.   -Per patient request for less IV iron, I would recommend IV iron with Ferritin 20 or below to avoid worsening anemia. Continue oral iron. She is on 2-3 times daily.  -Given stable disease will monitor with labs every 3 months. To save her from having to wait, we will call her with the results of her labs to schedule IV iron if needed.   2. Mild Leukopenia -With mild neutropenia. We did consider the  possibility of immune related neutropenia, or mild leukopenia secondary to her ethnicity. Has been chronically low since 2019.  -Remains mild and stable in 2-range. Will continuing observation, no indication of treatment at this time.      PLAN: -Continue oral iron  -Lab every 3 months with phone visit a day after to review results -We will set up IV Venofer 400mg  if ferritin less than 20, two doses if ferrin<10   No problem-specific Assessment & Plan notes found for this encounter.   INTERVAL HISTORY:  Sierra Mejia is here for a follow up of anemia. She was last seen by me on 07/10/20. She presents to the clinic alone. She reports she felt better after her recent IV iron. She denies fatigue today. She denies any new changes with her body. She notes she had to be out of town for the last month because of her mother passing.    All other systems were reviewed with the patient and are negative.  MEDICAL HISTORY:  Past Medical History:  Diagnosis Date   Anemia    Fibroids    Wears contact lenses     SURGICAL HISTORY: Past Surgical History:  Procedure Laterality Date   IR ANGIOGRAM PELVIS SELECTIVE OR SUPRASELECTIVE  02/05/2018   IR ANGIOGRAM PELVIS SELECTIVE OR SUPRASELECTIVE  02/05/2018   IR ANGIOGRAM SELECTIVE EACH ADDITIONAL VESSEL  02/05/2018   IR ANGIOGRAM SELECTIVE EACH ADDITIONAL VESSEL  02/05/2018   IR  EMBO TUMOR ORGAN ISCHEMIA INFARCT INC GUIDE ROADMAPPING  02/05/2018   IR RADIOLOGIST EVAL & MGMT  12/14/2017   IR RADIOLOGIST EVAL & MGMT  03/21/2018   IR RADIOLOGIST EVAL & MGMT  09/19/2018   IR US GUIDE VASC ACCESS RIGHT  02/05/2018   MYOMECTOMY     ORIF ANKLE FRACTURE Left 02/25/2015   Procedure: OPEN REDUCTION INTERNAL FIXATION (ORIF) ANKLE FRACTURE LEFT FIBULAR;  Surgeon: Samara Deist, DPM;  Location: [facility];  Service: Podiatry;  Laterality: Left;  WITH POPLITEAL    I have reviewed the social history and family history with the patient and they are  unchanged from previous note.  ALLERGIES:  has No Known Allergies.  MEDICATIONS:  Current Outpatient Medications  Medication Sig Dispense Refill   Ferrous Sulfate (IRON) 325 (65 Fe) MG TABS Take 1 tablet (325 mg total) by mouth 3 (three) times daily with meals. 90 tablet 3   No current facility-administered medications for this visit.    PHYSICAL EXAMINATION: ECOG PERFORMANCE STATUS: 0 - Asymptomatic  Vitals:   02/04/21 0950  BP: (!) 135/92  Pulse: 65  Resp: 16  Temp: 98.4 F (36.9 C)  SpO2: 100%   Wt Readings from Last 3 Encounters:  02/04/21 231 lb 6.4 oz (105 kg)  07/10/20 226 lb 1.6 oz (102.6 kg)  12/06/19 214 lb 14.4 oz (97.5 kg)     GENERAL:alert, no distress and comfortable SKIN: skin color normal, no rashes or significant lesions EYES: normal, Conjunctiva are pink and non-injected, sclera clear  NEURO: alert & oriented x 3 with fluent speech  LABORATORY DATA:  I have reviewed the data as listed CBC Latest Ref Rng & Units 02/04/2021 11/17/2020 10/23/2020  WBC 4.0 - 10.5 K/uL 2.1(L) 2.1(L) 2.2(L)  Hemoglobin 12.0 - 15.0 g/dL 11.1(L) 8.7(L) 8.6(L)  Hematocrit 36.0 - 46.0 % 35.2(L) 27.7(L) 27.6(L)  Platelets 150 - 400 K/uL 277 361 348     CMP Latest Ref Rng & Units 02/05/2018 11/29/2017  Glucose 70 - 99 mg/dL 79 79  BUN 6 - 20 mg/dL 12 12  Creatinine 0.44 - 1.00 mg/dL 0.65 0.67  Sodium 135 - 145 mmol/L 139 144  Potassium 3.5 - 5.1 mmol/L 4.0 3.9  Chloride 98 - 111 mmol/L 108 111  CO2 22 - 32 mmol/L 23 25  Calcium 8.9 - 10.3 mg/dL 8.4(L) 8.8(L)  Total Protein 6.5 - 8.1 g/dL - 6.3(L)  Total Bilirubin 0.3 - 1.2 mg/dL - 0.7  Alkaline Phos 38 - 126 U/L - 56  AST 15 - 41 U/L - 20  ALT 0 - 44 U/L - 16      RADIOGRAPHIC STUDIES: I have personally reviewed the radiological images as listed and agreed with the findings in the report. No results found.    No orders of the defined types were placed in this encounter.  All questions were answered. The patient  knows to call the clinic with any problems, questions or concerns. No barriers to learning was detected. The total time spent in the appointment was 20 minutes.     Truitt Merle, MD 02/04/2021   I, Wilburn Mylar, am acting as scribe for Truitt Merle, MD.   I have reviewed the above documentation for accuracy and completeness, and I agree with the above.

## 2021-02-08 ENCOUNTER — Telehealth: Payer: Self-pay | Admitting: *Deleted

## 2021-02-08 NOTE — Telephone Encounter (Signed)
-----   Message from Truitt Merle, MD sent at 02/08/2021  8:22 AM EST ----- Please let pt know her iron level is very low, and please schedule one more iv venofer 400mg  in next few weeks in addition to the infusion on 12/23.   Truitt Merle  02/08/2021

## 2021-02-08 NOTE — Telephone Encounter (Signed)
Per Dr.Feng, called pt with message below. Pt stated that she couldn't make 12/23 appt and would like to change it. Advised pt because of the holiday it may not be possible to change appt to requested day. Pt verbalized understanding and staff message was sent

## 2021-02-12 ENCOUNTER — Inpatient Hospital Stay: Payer: Managed Care, Other (non HMO)

## 2021-03-01 ENCOUNTER — Inpatient Hospital Stay: Payer: Managed Care, Other (non HMO) | Attending: Hematology

## 2021-03-01 ENCOUNTER — Other Ambulatory Visit: Payer: Self-pay

## 2021-03-01 VITALS — BP 121/80 | HR 58 | Temp 98.6°F | Resp 18

## 2021-03-01 DIAGNOSIS — D500 Iron deficiency anemia secondary to blood loss (chronic): Secondary | ICD-10-CM | POA: Diagnosis present

## 2021-03-01 DIAGNOSIS — D72819 Decreased white blood cell count, unspecified: Secondary | ICD-10-CM | POA: Insufficient documentation

## 2021-03-01 DIAGNOSIS — Z79899 Other long term (current) drug therapy: Secondary | ICD-10-CM | POA: Diagnosis not present

## 2021-03-01 DIAGNOSIS — N920 Excessive and frequent menstruation with regular cycle: Secondary | ICD-10-CM | POA: Insufficient documentation

## 2021-03-01 MED ORDER — SODIUM CHLORIDE 0.9 % IV SOLN
400.0000 mg | Freq: Once | INTRAVENOUS | Status: AC
  Administered 2021-03-01: 400 mg via INTRAVENOUS
  Filled 2021-03-01: qty 20

## 2021-03-01 MED ORDER — SODIUM CHLORIDE 0.9 % IV SOLN: Freq: Once | INTRAVENOUS | Status: AC

## 2021-03-01 NOTE — Patient Instructions (Signed)

## 2021-04-07 ENCOUNTER — Ambulatory Visit: Payer: BC Managed Care – PPO | Admitting: Hematology

## 2021-04-07 ENCOUNTER — Other Ambulatory Visit: Payer: BC Managed Care – PPO

## 2021-05-04 ENCOUNTER — Telehealth: Payer: Self-pay | Admitting: Hematology

## 2021-05-04 ENCOUNTER — Other Ambulatory Visit: Payer: Self-pay

## 2021-05-04 DIAGNOSIS — D500 Iron deficiency anemia secondary to blood loss (chronic): Secondary | ICD-10-CM

## 2021-05-04 NOTE — Telephone Encounter (Signed)
.  Called patient to schedule appointment per 3/14 inbasket, patient is aware of date and time.   ?

## 2021-05-05 ENCOUNTER — Inpatient Hospital Stay: Payer: Managed Care, Other (non HMO)

## 2021-05-06 ENCOUNTER — Inpatient Hospital Stay: Payer: Managed Care, Other (non HMO) | Admitting: Hematology

## 2021-05-06 ENCOUNTER — Other Ambulatory Visit: Payer: Self-pay

## 2021-05-06 ENCOUNTER — Inpatient Hospital Stay: Payer: Managed Care, Other (non HMO) | Attending: Hematology

## 2021-05-06 DIAGNOSIS — N920 Excessive and frequent menstruation with regular cycle: Secondary | ICD-10-CM | POA: Insufficient documentation

## 2021-05-06 DIAGNOSIS — D72819 Decreased white blood cell count, unspecified: Secondary | ICD-10-CM | POA: Insufficient documentation

## 2021-05-06 DIAGNOSIS — D500 Iron deficiency anemia secondary to blood loss (chronic): Secondary | ICD-10-CM | POA: Diagnosis not present

## 2021-05-06 DIAGNOSIS — Z79899 Other long term (current) drug therapy: Secondary | ICD-10-CM | POA: Diagnosis not present

## 2021-05-06 LAB — CBC WITH DIFFERENTIAL/PLATELET
Abs Immature Granulocytes: 0 10*3/uL (ref 0.00–0.07)
Basophils Absolute: 0.1 10*3/uL (ref 0.0–0.1)
Basophils Relative: 2 %
Eosinophils Absolute: 0 10*3/uL (ref 0.0–0.5)
Eosinophils Relative: 2 %
HCT: 32.8 % — ABNORMAL LOW (ref 36.0–46.0)
Hemoglobin: 10.7 g/dL — ABNORMAL LOW (ref 12.0–15.0)
Immature Granulocytes: 0 %
Lymphocytes Relative: 41 %
Lymphs Abs: 1 10*3/uL (ref 0.7–4.0)
MCH: 31.1 pg (ref 26.0–34.0)
MCHC: 32.6 g/dL (ref 30.0–36.0)
MCV: 95.3 fL (ref 80.0–100.0)
Monocytes Absolute: 0.3 10*3/uL (ref 0.1–1.0)
Monocytes Relative: 10 %
Neutro Abs: 1.1 10*3/uL — ABNORMAL LOW (ref 1.7–7.7)
Neutrophils Relative %: 45 %
Platelets: 278 10*3/uL (ref 150–400)
RBC: 3.44 MIL/uL — ABNORMAL LOW (ref 3.87–5.11)
RDW: 13.8 % (ref 11.5–15.5)
WBC: 2.5 10*3/uL — ABNORMAL LOW (ref 4.0–10.5)
nRBC: 0 % (ref 0.0–0.2)

## 2021-05-06 LAB — IRON AND IRON BINDING CAPACITY (CC-WL,HP ONLY)
Iron: 79 ug/dL (ref 28–170)
Saturation Ratios: 26 % (ref 10.4–31.8)
TIBC: 305 ug/dL (ref 250–450)
UIBC: 226 ug/dL (ref 148–442)

## 2021-05-06 LAB — FERRITIN: Ferritin: 9 ng/mL — ABNORMAL LOW (ref 11–307)

## 2021-05-07 ENCOUNTER — Encounter: Payer: Self-pay | Admitting: Hematology

## 2021-05-07 ENCOUNTER — Inpatient Hospital Stay (HOSPITAL_BASED_OUTPATIENT_CLINIC_OR_DEPARTMENT_OTHER): Payer: Managed Care, Other (non HMO) | Admitting: Hematology

## 2021-05-07 DIAGNOSIS — D500 Iron deficiency anemia secondary to blood loss (chronic): Secondary | ICD-10-CM

## 2021-05-07 NOTE — Progress Notes (Signed)
?[place]   ?[phone] [phone]   ?Clinic Follow up Note  ? ?Patient Care Team: ?Waymon Amato, MD as PCP - General (Obstetrics and Gynecology) ? ?Date of Service:  05/07/2021 ? ?I connected with Sierra Mejia on 05/07/2021 at 11:00 AM EDT by telephone visit and verified that I am speaking with the correct person using two identifiers.  ?I discussed the limitations, risks, security and privacy concerns of performing an evaluation and management service by telephone and the availability of in person appointments. I also discussed with the patient that there may be a patient responsible charge related to this service. The patient expressed understanding and agreed to proceed.  ? ?Other persons participating in the visit and their role in the encounter:  none  ? ?Patient's location:  Home  ?Provider's location:  my office ? ?CHIEF COMPLAINT: f/u of anemia ? ?CURRENT THERAPY:  ?-Oral iron 1-2 times a day (not consistently due to tolerance)  ?-IV Feraheme as needed for ferritin <20-30 since 07/2016. Switched to IV Venofer in 01/2019.  ? ?ASSESSMENT & PLAN:  ?Sierra Mejia is a 41 y.o. female with  ? ?1. Iron deficient anemia, secondary to menorrhagia ?-She has chronic anemia and iron deficiency, previously required blood transfusion once and IV iron in 2018 and 2019.  ?-She had a negative stool OB in 09/2017.  ?-She has menorrhagia from fibroid. This is likely the cause of her IDA. She has tried birth control in the past without much help. She had 3 months Lupron injection in 12/2017 and endometrial ablation in 01/2018. Her period initially lightened but became heavy again ?-She is currently on oral iron 1-2 times a day but not consistent. She is also on IV Feraheme for ferritin<30 as needed since 07/2016. Switched to IV Venofer in 01/2019. Dose increased to '400mg'$  on 2/54/98 due to less frequent infusions. ?-Labs from yesterday reviewed, globin 10.7, ferritin 9, I recommend IV iron.   Per patient she has not been taking oral iron, and would like to try oral iron again.  And IV iron for now ?  ?2. Mild Leukopenia ?-With mild neutropenia. We did consider the possibility of immune related neutropenia, or mild leukopenia secondary to her ethnicity. Has been chronically low since 2019.  ?-Remains mild and stable in 2-range. Will continuing observation, no indication of treatment at this time.  ?  ?  ?PLAN: ?-Lab reviewed, patient declined IV iron for now, will restart oral iron ?-Every 2 months, follow-up in 6 months ?-IV Venofer 400 mg once if ferritin less than 20, if patient agrees ? ? ?No problem-specific Assessment & Plan notes found for this encounter. ? ? ?SUMMARY OF ONCOLOGIC HISTORY: ?Oncology History  ? No history exists.  ? ? ? ?INTERVAL HISTORY:  ?Sierra Mejia was contacted for a follow up of anemia. She was last seen by me on 02/04/21.  Her last IV Venofer was on March 01, 2021.  She is clinically stable, still have heavy menstrual period.  Her energy is fair, she denies chest pain or dyspnea.  She has not been taking oral iron lately. ?  ?All other systems were reviewed with the patient and are negative. ? ?MEDICAL HISTORY:  ?Past Medical History:  ?Diagnosis Date  ? Anemia   ? Fibroids   ? Wears contact lenses   ? ? ?SURGICAL HISTORY: ?Past Surgical History:  ?Procedure Laterality Date  ? IR ANGIOGRAM PELVIS SELECTIVE OR SUPRASELECTIVE  02/05/2018  ? IR ANGIOGRAM PELVIS  SELECTIVE OR SUPRASELECTIVE  02/05/2018  ? IR ANGIOGRAM SELECTIVE EACH ADDITIONAL VESSEL  02/05/2018  ? IR ANGIOGRAM SELECTIVE EACH ADDITIONAL VESSEL  02/05/2018  ? IR EMBO TUMOR ORGAN ISCHEMIA INFARCT INC GUIDE ROADMAPPING  02/05/2018  ? IR RADIOLOGIST EVAL & MGMT  12/14/2017  ? IR RADIOLOGIST EVAL & MGMT  03/21/2018  ? IR RADIOLOGIST EVAL & MGMT  09/19/2018  ? IR US GUIDE VASC ACCESS RIGHT  02/05/2018  ? MYOMECTOMY    ? ORIF ANKLE FRACTURE Left 02/25/2015  ? Procedure: OPEN REDUCTION INTERNAL FIXATION (ORIF) ANKLE  FRACTURE LEFT FIBULAR;  Surgeon: Samara Deist, DPM;  Location: [facility];  Service: Podiatry;  Laterality: Left;  WITH POPLITEAL  ? ? ?I have reviewed the social history and family history with the patient and they are unchanged from previous note. ? ?ALLERGIES:  has No Known Allergies. ? ?MEDICATIONS:  ?Current Outpatient Medications  ?Medication Sig Dispense Refill  ? Ferrous Sulfate (IRON) 325 (65 Fe) MG TABS Take 1 tablet (325 mg total) by mouth 3 (three) times daily with meals. 90 tablet 3  ? ?No current facility-administered medications for this visit.  ? ? ?PHYSICAL EXAMINATION: ?ECOG PERFORMANCE STATUS: 0 - Asymptomatic ? ?There were no vitals filed for this visit. ?Wt Readings from Last 3 Encounters:  ?02/04/21 231 lb 6.4 oz (105 kg)  ?07/10/20 226 lb 1.6 oz (102.6 kg)  ?12/06/19 214 lb 14.4 oz (97.5 kg)  ?  ? ?No vitals taken today, Exam not performed today ? ?LABORATORY DATA:  ?I have reviewed the data as listed ?CBC Latest Ref Rng & Units 05/06/2021 02/04/2021 11/17/2020  ?WBC 4.0 - 10.5 K/uL 2.5(L) 2.1(L) 2.1(L)  ?Hemoglobin 12.0 - 15.0 g/dL 10.7(L) 11.1(L) 8.7(L)  ?Hematocrit 36.0 - 46.0 % 32.8(L) 35.2(L) 27.7(L)  ?Platelets 150 - 400 K/uL 278 277 361  ? ? ? ?CMP Latest Ref Rng & Units 02/05/2018 11/29/2017  ?Glucose 70 - 99 mg/dL 79 79  ?BUN 6 - 20 mg/dL 12 12  ?Creatinine 0.44 - 1.00 mg/dL 0.65 0.67  ?Sodium 135 - 145 mmol/L 139 144  ?Potassium 3.5 - 5.1 mmol/L 4.0 3.9  ?Chloride 98 - 111 mmol/L 108 111  ?CO2 22 - 32 mmol/L 23 25  ?Calcium 8.9 - 10.3 mg/dL 8.4(L) 8.8(L)  ?Total Protein 6.5 - 8.1 g/dL - 6.3(L)  ?Total Bilirubin 0.3 - 1.2 mg/dL - 0.7  ?Alkaline Phos 38 - 126 U/L - 56  ?AST 15 - 41 U/L - 20  ?ALT 0 - 44 U/L - 16  ? ? ? ? ?RADIOGRAPHIC STUDIES: ?I have personally reviewed the radiological images as listed and agreed with the findings in the report. ?No results found.  ? ? ?No orders of the defined types were placed in this encounter. ? ?All questions were answered. The patient  knows to call the clinic with any problems, questions or concerns. No barriers to learning was detected. ?The total time spent in the appointment was 15 minutes. ? ?  ? Truitt Merle, MD ?05/07/2021  ? ?I, Wilburn Mylar, am acting as scribe for Truitt Merle, MD.  ? ?I have reviewed the above documentation for accuracy and completeness, and I agree with the above. ?  ? ? ?

## 2021-05-10 ENCOUNTER — Telehealth: Payer: Self-pay | Admitting: Hematology

## 2021-05-10 NOTE — Telephone Encounter (Signed)
Scheduled follow-up appointment per 3/17 los. Patient is aware. ?

## 2021-07-08 ENCOUNTER — Inpatient Hospital Stay: Payer: Managed Care, Other (non HMO) | Attending: Hematology

## 2021-09-08 ENCOUNTER — Telehealth: Payer: Self-pay | Admitting: Hematology

## 2021-09-08 NOTE — Telephone Encounter (Signed)
.  Called patient to schedule appointment per 7/19 inbasket, patient is aware of date and time.   

## 2021-09-09 ENCOUNTER — Inpatient Hospital Stay: Payer: Managed Care, Other (non HMO)

## 2021-10-07 ENCOUNTER — Inpatient Hospital Stay: Payer: Managed Care, Other (non HMO) | Attending: Hematology

## 2021-10-07 ENCOUNTER — Other Ambulatory Visit: Payer: Self-pay

## 2021-10-07 DIAGNOSIS — N920 Excessive and frequent menstruation with regular cycle: Secondary | ICD-10-CM | POA: Diagnosis not present

## 2021-10-07 DIAGNOSIS — D72829 Elevated white blood cell count, unspecified: Secondary | ICD-10-CM | POA: Diagnosis not present

## 2021-10-07 DIAGNOSIS — Z79899 Other long term (current) drug therapy: Secondary | ICD-10-CM | POA: Insufficient documentation

## 2021-10-07 DIAGNOSIS — D500 Iron deficiency anemia secondary to blood loss (chronic): Secondary | ICD-10-CM | POA: Insufficient documentation

## 2021-10-07 LAB — CBC WITH DIFFERENTIAL/PLATELET
Abs Immature Granulocytes: 0 10*3/uL (ref 0.00–0.07)
Basophils Absolute: 0.1 10*3/uL (ref 0.0–0.1)
Basophils Relative: 2 %
Eosinophils Absolute: 0.1 10*3/uL (ref 0.0–0.5)
Eosinophils Relative: 2 %
HCT: 28.1 % — ABNORMAL LOW (ref 36.0–46.0)
Hemoglobin: 9 g/dL — ABNORMAL LOW (ref 12.0–15.0)
Immature Granulocytes: 0 %
Lymphocytes Relative: 47 %
Lymphs Abs: 1.2 10*3/uL (ref 0.7–4.0)
MCH: 25.9 pg — ABNORMAL LOW (ref 26.0–34.0)
MCHC: 32 g/dL (ref 30.0–36.0)
MCV: 81 fL (ref 80.0–100.0)
Monocytes Absolute: 0.3 10*3/uL (ref 0.1–1.0)
Monocytes Relative: 11 %
Neutro Abs: 1 10*3/uL — ABNORMAL LOW (ref 1.7–7.7)
Neutrophils Relative %: 38 %
Platelets: 295 10*3/uL (ref 150–400)
RBC: 3.47 MIL/uL — ABNORMAL LOW (ref 3.87–5.11)
RDW: 17.2 % — ABNORMAL HIGH (ref 11.5–15.5)
WBC: 2.5 10*3/uL — ABNORMAL LOW (ref 4.0–10.5)
nRBC: 0 % (ref 0.0–0.2)

## 2021-10-07 LAB — IRON AND IRON BINDING CAPACITY (CC-WL,HP ONLY)
Iron: 107 ug/dL (ref 28–170)
Saturation Ratios: 33 % — ABNORMAL HIGH (ref 10.4–31.8)
TIBC: 326 ug/dL (ref 250–450)
UIBC: 219 ug/dL (ref 148–442)

## 2021-10-07 LAB — FERRITIN: Ferritin: 3 ng/mL — ABNORMAL LOW (ref 11–307)

## 2021-11-11 ENCOUNTER — Inpatient Hospital Stay: Payer: Managed Care, Other (non HMO) | Admitting: Hematology

## 2021-11-11 ENCOUNTER — Inpatient Hospital Stay: Payer: Managed Care, Other (non HMO)

## 2021-12-23 ENCOUNTER — Inpatient Hospital Stay: Payer: Managed Care, Other (non HMO)

## 2021-12-23 ENCOUNTER — Encounter: Payer: Self-pay | Admitting: Hematology

## 2021-12-23 ENCOUNTER — Other Ambulatory Visit: Payer: Self-pay

## 2021-12-23 ENCOUNTER — Inpatient Hospital Stay: Payer: Managed Care, Other (non HMO) | Attending: Hematology | Admitting: Hematology

## 2021-12-23 VITALS — BP 118/70 | HR 67 | Temp 98.7°F | Resp 16 | Ht 69.0 in | Wt 255.1 lb

## 2021-12-23 DIAGNOSIS — D72819 Decreased white blood cell count, unspecified: Secondary | ICD-10-CM | POA: Diagnosis not present

## 2021-12-23 DIAGNOSIS — D709 Neutropenia, unspecified: Secondary | ICD-10-CM | POA: Insufficient documentation

## 2021-12-23 DIAGNOSIS — D500 Iron deficiency anemia secondary to blood loss (chronic): Secondary | ICD-10-CM

## 2021-12-23 DIAGNOSIS — N920 Excessive and frequent menstruation with regular cycle: Secondary | ICD-10-CM | POA: Insufficient documentation

## 2021-12-23 DIAGNOSIS — Z79899 Other long term (current) drug therapy: Secondary | ICD-10-CM | POA: Diagnosis not present

## 2021-12-23 LAB — CBC WITH DIFFERENTIAL/PLATELET
Abs Immature Granulocytes: 0 10*3/uL (ref 0.00–0.07)
Basophils Absolute: 0.1 10*3/uL (ref 0.0–0.1)
Basophils Relative: 3 %
Eosinophils Absolute: 0 10*3/uL (ref 0.0–0.5)
Eosinophils Relative: 1 %
HCT: 27.8 % — ABNORMAL LOW (ref 36.0–46.0)
Hemoglobin: 8.6 g/dL — ABNORMAL LOW (ref 12.0–15.0)
Immature Granulocytes: 0 %
Lymphocytes Relative: 43 %
Lymphs Abs: 1 10*3/uL (ref 0.7–4.0)
MCH: 24.2 pg — ABNORMAL LOW (ref 26.0–34.0)
MCHC: 30.9 g/dL (ref 30.0–36.0)
MCV: 78.3 fL — ABNORMAL LOW (ref 80.0–100.0)
Monocytes Absolute: 0.2 10*3/uL (ref 0.1–1.0)
Monocytes Relative: 10 %
Neutro Abs: 1 10*3/uL — ABNORMAL LOW (ref 1.7–7.7)
Neutrophils Relative %: 43 %
Platelets: 352 10*3/uL (ref 150–400)
RBC: 3.55 MIL/uL — ABNORMAL LOW (ref 3.87–5.11)
RDW: 17.3 % — ABNORMAL HIGH (ref 11.5–15.5)
WBC: 2.3 10*3/uL — ABNORMAL LOW (ref 4.0–10.5)
nRBC: 0 % (ref 0.0–0.2)

## 2021-12-23 LAB — IRON AND IRON BINDING CAPACITY (CC-WL,HP ONLY)
Iron: 26 ug/dL — ABNORMAL LOW (ref 28–170)
Saturation Ratios: 6 % — ABNORMAL LOW (ref 10.4–31.8)
TIBC: 431 ug/dL (ref 250–450)
UIBC: 405 ug/dL (ref 148–442)

## 2021-12-23 LAB — FERRITIN: Ferritin: 2 ng/mL — ABNORMAL LOW (ref 11–307)

## 2021-12-23 NOTE — Progress Notes (Signed)
[place]   [phone] [phone]   Clinic Follow up Note   Patient Care Team: Waymon Amato, MD as PCP - General (Obstetrics and Gynecology)  Date of Service:  12/23/2021  CHIEF COMPLAINT: f/u of anemia  CURRENT THERAPY:  -IV Venofer as needed for ferritin <10 -oral iron (inconsistently)  ASSESSMENT & PLAN:  Sierra Mejia is a 41 y.o. female with   1. Iron deficient anemia, secondary to menorrhagia -previously required blood transfusion and IV iron  -negative stool OB in 09/2017.  -She has menorrhagia from fibroid, likely the cause of her IDA. She has tried birth control, 3 months Lupron, and endometrial ablation. Her periods remain heavy. -she takes oral iron but not consistently. She responds well to IV iron, last in 02/2021.  However she is reluctant to continue IV iron although she previously tolerated very well. -labs reviewed, hgb down to 8.6. I recommend continuing IV iron as needed while she is still having periods; I predict her anemia will resolve when her periods stop.  We discussed the chronic complication from anemia, such as CHF, and I encouraged her to consider IV iron.  After lengthy discussion, she agrees to proceed with IV iron.   2. Mild Leukopenia with neutropenia -chronically low since 2019.  -Remains mild and stable with WBC in 2-range and ANC around 1. Will continuing observation, no indication of treatment at this time.   3. Cancer Screening -she is due to start annual mammograms. I offered to order for her today, she declines and wants to look into screening options. -she notes she is overdue for pap smear, she agrees to follow-up with GYN.     PLAN: -IV Venofer '400mg'$  weekly x2 in next month -lab in 4 and 8 months, will schedule Venofer if her ferritin less than 10 -f/u in 8 months   No problem-specific Assessment & Plan notes found for this encounter.   INTERVAL HISTORY:  Sierra Mejia is here for a follow up  of anemia. She was last seen by me on 05/07/21. She presents to the clinic alone. She reports she is feeling well overall. She notes her periods are still moderately heavy, with her period in September being particularly heavy. She endorses taking oral iron but not daily.   All other systems were reviewed with the patient and are negative.  MEDICAL HISTORY:  Past Medical History:  Diagnosis Date   Anemia    Fibroids    Wears contact lenses     SURGICAL HISTORY: Past Surgical History:  Procedure Laterality Date   IR ANGIOGRAM PELVIS SELECTIVE OR SUPRASELECTIVE  02/05/2018   IR ANGIOGRAM PELVIS SELECTIVE OR SUPRASELECTIVE  02/05/2018   IR ANGIOGRAM SELECTIVE EACH ADDITIONAL VESSEL  02/05/2018   IR ANGIOGRAM SELECTIVE EACH ADDITIONAL VESSEL  02/05/2018   IR EMBO TUMOR ORGAN ISCHEMIA INFARCT INC GUIDE ROADMAPPING  02/05/2018   IR RADIOLOGIST EVAL & MGMT  12/14/2017   IR RADIOLOGIST EVAL & MGMT  03/21/2018   IR RADIOLOGIST EVAL & MGMT  09/19/2018   IR US GUIDE VASC ACCESS RIGHT  02/05/2018   MYOMECTOMY     ORIF ANKLE FRACTURE Left 02/25/2015   Procedure: OPEN REDUCTION INTERNAL FIXATION (ORIF) ANKLE FRACTURE LEFT FIBULAR;  Surgeon: Samara Deist, DPM;  Location: [facility];  Service: Podiatry;  Laterality: Left;  WITH POPLITEAL    I have reviewed the social history and family history with the patient and they are unchanged from previous note.  ALLERGIES:  has  No Known Allergies.  MEDICATIONS:  Current Outpatient Medications  Medication Sig Dispense Refill   Ferrous Sulfate (IRON) 325 (65 Fe) MG TABS Take 1 tablet (325 mg total) by mouth 3 (three) times daily with meals. 90 tablet 3   No current facility-administered medications for this visit.    PHYSICAL EXAMINATION: ECOG PERFORMANCE STATUS: 0 - Asymptomatic  Vitals:   12/23/21 0956  BP: 118/70  Pulse: 67  Resp: 16  Temp: 98.7 F (37.1 C)  SpO2: 100%   Wt Readings from Last 3 Encounters:  12/23/21 255 lb 1.6 oz  (115.7 kg)  02/04/21 231 lb 6.4 oz (105 kg)  07/10/20 226 lb 1.6 oz (102.6 kg)     GENERAL:alert, no distress and comfortable SKIN: skin color normal, no rashes or significant lesions EYES: normal, Conjunctiva are pink and non-injected, sclera clear  NEURO: alert & oriented x 3 with fluent speech  LABORATORY DATA:  I have reviewed the data as listed    Latest Ref Rng & Units 12/23/2021    9:49 AM 10/07/2021    8:42 AM 05/06/2021    8:49 AM  CBC  WBC 4.0 - 10.5 K/uL 2.3  2.5  2.5   Hemoglobin 12.0 - 15.0 g/dL 8.6  9.0  10.7   Hematocrit 36.0 - 46.0 % 27.8  28.1  32.8   Platelets 150 - 400 K/uL 352  295  278         Latest Ref Rng & Units 02/05/2018    9:02 AM 11/29/2017    3:52 AM  CMP  Glucose 70 - 99 mg/dL 79  79   BUN 6 - 20 mg/dL 12  12   Creatinine 0.44 - 1.00 mg/dL 0.65  0.67   Sodium 135 - 145 mmol/L 139  144   Potassium 3.5 - 5.1 mmol/L 4.0  3.9   Chloride 98 - 111 mmol/L 108  111   CO2 22 - 32 mmol/L 23  25   Calcium 8.9 - 10.3 mg/dL 8.4  8.8   Total Protein 6.5 - 8.1 g/dL  6.3   Total Bilirubin 0.3 - 1.2 mg/dL  0.7   Alkaline Phos 38 - 126 U/L  56   AST 15 - 41 U/L  20   ALT 0 - 44 U/L  16       RADIOGRAPHIC STUDIES: I have personally reviewed the radiological images as listed and agreed with the findings in the report. No results found.    No orders of the defined types were placed in this encounter.  All questions were answered. The patient knows to call the clinic with any problems, questions or concerns. No barriers to learning was detected. The total time spent in the appointment was 30 minutes.     Truitt Merle, MD 12/23/2021   I, Wilburn Mylar, am acting as scribe for Truitt Merle, MD.   I have reviewed the above documentation for accuracy and completeness, and I agree with the above.

## 2021-12-24 ENCOUNTER — Other Ambulatory Visit: Payer: Self-pay | Admitting: Hematology

## 2021-12-24 DIAGNOSIS — Z1231 Encounter for screening mammogram for malignant neoplasm of breast: Secondary | ICD-10-CM

## 2021-12-30 ENCOUNTER — Inpatient Hospital Stay: Payer: Managed Care, Other (non HMO)

## 2021-12-30 VITALS — BP 120/69 | HR 75 | Temp 99.0°F | Resp 18

## 2021-12-30 DIAGNOSIS — D500 Iron deficiency anemia secondary to blood loss (chronic): Secondary | ICD-10-CM | POA: Diagnosis not present

## 2021-12-30 MED ORDER — SODIUM CHLORIDE 0.9 % IV SOLN: INTRAVENOUS | Status: DC

## 2021-12-30 MED ORDER — SODIUM CHLORIDE 0.9 % IV SOLN
400.0000 mg | Freq: Once | INTRAVENOUS | Status: AC
  Administered 2021-12-30: 400 mg via INTRAVENOUS
  Filled 2021-12-30: qty 20

## 2021-12-30 NOTE — Patient Instructions (Signed)

## 2021-12-30 NOTE — Progress Notes (Signed)
Patient tolerated her iron very well- as always per pt. Patient declined to stay for her 30 minute observation- stable- VSS- BP 120/69 (BP Location: Right Arm, Patient Position: Sitting)   Pulse 75   Temp 99 F (37.2 C) (Oral)   Resp 18   SpO2 100%

## 2021-12-31 ENCOUNTER — Ambulatory Visit
Admission: RE | Admit: 2021-12-31 | Discharge: 2021-12-31 | Disposition: A | Discharge status: Home / Self Care | Payer: Managed Care, Other (non HMO) | Source: Ambulatory Visit

## 2021-12-31 DIAGNOSIS — Z1231 Encounter for screening mammogram for malignant neoplasm of breast: Secondary | ICD-10-CM

## 2022-01-06 ENCOUNTER — Inpatient Hospital Stay: Payer: Managed Care, Other (non HMO)

## 2022-01-06 ENCOUNTER — Other Ambulatory Visit: Payer: Self-pay

## 2022-01-06 VITALS — BP 127/79 | HR 70 | Temp 98.6°F | Resp 18

## 2022-01-06 DIAGNOSIS — D500 Iron deficiency anemia secondary to blood loss (chronic): Secondary | ICD-10-CM

## 2022-01-06 MED ORDER — SODIUM CHLORIDE 0.9 % IV SOLN
400.0000 mg | Freq: Once | INTRAVENOUS | Status: AC
  Administered 2022-01-06: 400 mg via INTRAVENOUS
  Filled 2022-01-06: qty 20

## 2022-01-06 MED ORDER — SODIUM CHLORIDE 0.9 % IV SOLN: Freq: Once | INTRAVENOUS | Status: AC

## 2022-01-06 NOTE — Patient Instructions (Signed)

## 2022-01-28 ENCOUNTER — Ambulatory Visit: Payer: Managed Care, Other (non HMO)

## 2022-02-04 ENCOUNTER — Ambulatory Visit: Payer: Managed Care, Other (non HMO)

## 2022-03-08 ENCOUNTER — Inpatient Hospital Stay: Payer: Managed Care, Other (non HMO)

## 2022-03-10 ENCOUNTER — Other Ambulatory Visit: Payer: Self-pay

## 2022-03-10 ENCOUNTER — Inpatient Hospital Stay: Payer: Managed Care, Other (non HMO) | Attending: Hematology

## 2022-03-10 DIAGNOSIS — N920 Excessive and frequent menstruation with regular cycle: Secondary | ICD-10-CM | POA: Diagnosis not present

## 2022-03-10 DIAGNOSIS — D500 Iron deficiency anemia secondary to blood loss (chronic): Secondary | ICD-10-CM | POA: Diagnosis present

## 2022-03-10 DIAGNOSIS — Z79899 Other long term (current) drug therapy: Secondary | ICD-10-CM | POA: Diagnosis not present

## 2022-03-10 LAB — IRON AND IRON BINDING CAPACITY (CC-WL,HP ONLY)
Iron: 43 ug/dL (ref 28–170)
Saturation Ratios: 12 % (ref 10.4–31.8)
TIBC: 354 ug/dL (ref 250–450)
UIBC: 311 ug/dL (ref 148–442)

## 2022-03-10 LAB — CBC WITH DIFFERENTIAL/PLATELET
Abs Immature Granulocytes: 0 10*3/uL (ref 0.00–0.07)
Basophils Absolute: 0.1 10*3/uL (ref 0.0–0.1)
Basophils Relative: 2 %
Eosinophils Absolute: 0 10*3/uL (ref 0.0–0.5)
Eosinophils Relative: 1 %
HCT: 30.7 % — ABNORMAL LOW (ref 36.0–46.0)
Hemoglobin: 9.9 g/dL — ABNORMAL LOW (ref 12.0–15.0)
Immature Granulocytes: 0 %
Lymphocytes Relative: 39 %
Lymphs Abs: 1 10*3/uL (ref 0.7–4.0)
MCH: 26.6 pg (ref 26.0–34.0)
MCHC: 32.2 g/dL (ref 30.0–36.0)
MCV: 82.5 fL (ref 80.0–100.0)
Monocytes Absolute: 0.3 10*3/uL (ref 0.1–1.0)
Monocytes Relative: 10 %
Neutro Abs: 1.3 10*3/uL — ABNORMAL LOW (ref 1.7–7.7)
Neutrophils Relative %: 48 %
Platelets: 320 10*3/uL (ref 150–400)
RBC: 3.72 MIL/uL — ABNORMAL LOW (ref 3.87–5.11)
RDW: 19.6 % — ABNORMAL HIGH (ref 11.5–15.5)
WBC: 2.6 10*3/uL — ABNORMAL LOW (ref 4.0–10.5)
nRBC: 0 % (ref 0.0–0.2)

## 2022-03-10 LAB — FERRITIN: Ferritin: 3 ng/mL — ABNORMAL LOW (ref 11–307)

## 2022-03-14 ENCOUNTER — Telehealth: Payer: Self-pay

## 2022-03-14 NOTE — Telephone Encounter (Addendum)
Called patient and made her aware of results below, told her that scheduling will be calling her to make the appointments for the iron infusions. She voiced understanding    ----- Message from Truitt Merle, MD sent at 03/13/2022 10:52 AM EST ----- Please let pt know her lab results, iron level very low, she remains to be anemic, I recommend iv venofer '400mg'$  weeklyX3, and encourage her to keep her appointments, thanks   Truitt Merle

## 2022-03-16 ENCOUNTER — Other Ambulatory Visit: Payer: Self-pay

## 2022-03-18 ENCOUNTER — Telehealth: Payer: Self-pay | Admitting: Hematology

## 2022-03-18 NOTE — Telephone Encounter (Signed)
Called patient regarding inbasket message, rescheduled all treatments per patient's request. Patient is notified.

## 2022-03-19 ENCOUNTER — Inpatient Hospital Stay: Payer: Managed Care, Other (non HMO)

## 2022-03-25 ENCOUNTER — Inpatient Hospital Stay: Payer: Managed Care, Other (non HMO) | Attending: Hematology

## 2022-03-25 VITALS — BP 136/84 | HR 66 | Temp 99.1°F | Resp 19

## 2022-03-25 DIAGNOSIS — D709 Neutropenia, unspecified: Secondary | ICD-10-CM | POA: Diagnosis not present

## 2022-03-25 DIAGNOSIS — D72819 Decreased white blood cell count, unspecified: Secondary | ICD-10-CM | POA: Diagnosis not present

## 2022-03-25 DIAGNOSIS — D500 Iron deficiency anemia secondary to blood loss (chronic): Secondary | ICD-10-CM

## 2022-03-25 DIAGNOSIS — Z79899 Other long term (current) drug therapy: Secondary | ICD-10-CM | POA: Diagnosis not present

## 2022-03-25 DIAGNOSIS — N920 Excessive and frequent menstruation with regular cycle: Secondary | ICD-10-CM | POA: Insufficient documentation

## 2022-03-25 MED ORDER — SODIUM CHLORIDE 0.9 % IV SOLN
400.0000 mg | Freq: Once | INTRAVENOUS | Status: AC
  Administered 2022-03-25: 400 mg via INTRAVENOUS
  Filled 2022-03-25: qty 20

## 2022-03-25 MED ORDER — SODIUM CHLORIDE 0.9 % IV SOLN: Freq: Once | INTRAVENOUS | Status: AC

## 2022-03-25 MED ORDER — LORATADINE 10 MG PO TABS: 10.0000 mg | ORAL_TABLET | Freq: Every day | ORAL | Status: DC

## 2022-03-25 NOTE — Progress Notes (Signed)
Pt declined to be observed for 30 minutes post Venofer infusion. Pt tolerated trtmt well w/out incident. VSS at discharge.  Ambulatory to lobby.

## 2022-03-25 NOTE — Patient Instructions (Signed)

## 2022-03-26 ENCOUNTER — Ambulatory Visit: Payer: Managed Care, Other (non HMO)

## 2022-04-01 ENCOUNTER — Inpatient Hospital Stay: Payer: Managed Care, Other (non HMO)

## 2022-04-02 ENCOUNTER — Inpatient Hospital Stay: Payer: Managed Care, Other (non HMO)

## 2022-04-08 ENCOUNTER — Inpatient Hospital Stay: Payer: Managed Care, Other (non HMO)

## 2022-04-08 VITALS — BP 123/70 | HR 79 | Temp 98.1°F | Resp 18

## 2022-04-08 DIAGNOSIS — D500 Iron deficiency anemia secondary to blood loss (chronic): Secondary | ICD-10-CM | POA: Diagnosis not present

## 2022-04-08 MED ORDER — SODIUM CHLORIDE 0.9 % IV SOLN: Freq: Once | INTRAVENOUS | Status: AC

## 2022-04-08 MED ORDER — LORATADINE 10 MG PO TABS: 10.0000 mg | ORAL_TABLET | Freq: Every day | ORAL | Status: DC

## 2022-04-08 MED ORDER — SODIUM CHLORIDE 0.9 % IV SOLN
400.0000 mg | Freq: Once | INTRAVENOUS | Status: AC
  Administered 2022-04-08: 400 mg via INTRAVENOUS
  Filled 2022-04-08: qty 20

## 2022-04-08 NOTE — Patient Instructions (Signed)

## 2022-04-21 ENCOUNTER — Other Ambulatory Visit: Payer: Self-pay

## 2022-04-21 ENCOUNTER — Inpatient Hospital Stay: Payer: Managed Care, Other (non HMO)

## 2022-04-21 DIAGNOSIS — D500 Iron deficiency anemia secondary to blood loss (chronic): Secondary | ICD-10-CM

## 2022-04-21 LAB — IRON AND IRON BINDING CAPACITY (CC-WL,HP ONLY)
Iron: 52 ug/dL (ref 28–170)
Saturation Ratios: 17 % (ref 10.4–31.8)
TIBC: 308 ug/dL (ref 250–450)
UIBC: 256 ug/dL (ref 148–442)

## 2022-04-21 LAB — CBC WITH DIFFERENTIAL/PLATELET
Abs Immature Granulocytes: 0 10*3/uL (ref 0.00–0.07)
Basophils Absolute: 0 10*3/uL (ref 0.0–0.1)
Basophils Relative: 2 %
Eosinophils Absolute: 0 10*3/uL (ref 0.0–0.5)
Eosinophils Relative: 2 %
HCT: 31.5 % — ABNORMAL LOW (ref 36.0–46.0)
Hemoglobin: 10.3 g/dL — ABNORMAL LOW (ref 12.0–15.0)
Immature Granulocytes: 0 %
Lymphocytes Relative: 41 %
Lymphs Abs: 1.1 10*3/uL (ref 0.7–4.0)
MCH: 28.1 pg (ref 26.0–34.0)
MCHC: 32.7 g/dL (ref 30.0–36.0)
MCV: 86.1 fL (ref 80.0–100.0)
Monocytes Absolute: 0.2 10*3/uL (ref 0.1–1.0)
Monocytes Relative: 9 %
Neutro Abs: 1.3 10*3/uL — ABNORMAL LOW (ref 1.7–7.7)
Neutrophils Relative %: 46 %
Platelets: 267 10*3/uL (ref 150–400)
RBC: 3.66 MIL/uL — ABNORMAL LOW (ref 3.87–5.11)
RDW: 20.2 % — ABNORMAL HIGH (ref 11.5–15.5)
WBC: 2.6 10*3/uL — ABNORMAL LOW (ref 4.0–10.5)
nRBC: 0 % (ref 0.0–0.2)

## 2022-04-21 LAB — FERRITIN: Ferritin: 42 ng/mL (ref 11–307)

## 2022-04-22 ENCOUNTER — Encounter: Payer: Self-pay | Admitting: Hematology

## 2022-08-24 ENCOUNTER — Telehealth: Payer: Self-pay | Admitting: Hematology

## 2022-08-24 ENCOUNTER — Other Ambulatory Visit: Payer: Self-pay

## 2022-08-24 DIAGNOSIS — D500 Iron deficiency anemia secondary to blood loss (chronic): Secondary | ICD-10-CM

## 2022-08-26 ENCOUNTER — Inpatient Hospital Stay: Payer: Managed Care, Other (non HMO) | Admitting: Hematology

## 2022-08-26 ENCOUNTER — Inpatient Hospital Stay: Payer: Managed Care, Other (non HMO)

## 2022-09-14 DIAGNOSIS — D709 Neutropenia, unspecified: Secondary | ICD-10-CM | POA: Insufficient documentation

## 2022-09-14 NOTE — Assessment & Plan Note (Signed)
-  previously required blood transfusion and IV iron  -negative stool OB in 09/2017.  -She has menorrhagia from fibroid, likely the cause of her IDA. She has tried birth control, 3 months Lupron, and endometrial ablation. Her periods remain heavy. -she takes oral iron but not consistently. She responds well to IV iron, last in 03/2022   -Due to her persistent mild to moderate anemia, I recommended her to have lab and IV Venofer every 2 months, as long as she still has heavy menstrual period.  However she is little reluctant to do that, but agrees to do labs and scheduled IV Venofer 400 mg every 3 months.  Will treat her if her previous ferritin is not high.  If I see her ferritin is significantly low, may add on additional IV iron.

## 2022-09-14 NOTE — Assessment & Plan Note (Signed)
-  She has chronic mild leukopenia, with predominant mild neutropenia. -mild leukopenia secondary to her ethnicity. Has been chronically low since 2019.  -Remains mild and stable in 2-range. Will continuing observation, no indication of treatment at this time.

## 2022-09-15 ENCOUNTER — Inpatient Hospital Stay (HOSPITAL_BASED_OUTPATIENT_CLINIC_OR_DEPARTMENT_OTHER): Payer: Managed Care, Other (non HMO) | Admitting: Hematology

## 2022-09-15 ENCOUNTER — Other Ambulatory Visit: Payer: Self-pay

## 2022-09-15 ENCOUNTER — Inpatient Hospital Stay: Payer: Managed Care, Other (non HMO) | Attending: Hematology

## 2022-09-15 VITALS — BP 127/84 | HR 68 | Temp 98.8°F | Resp 18 | Ht 69.0 in | Wt 273.8 lb

## 2022-09-15 DIAGNOSIS — Z79899 Other long term (current) drug therapy: Secondary | ICD-10-CM | POA: Insufficient documentation

## 2022-09-15 DIAGNOSIS — N920 Excessive and frequent menstruation with regular cycle: Secondary | ICD-10-CM | POA: Diagnosis not present

## 2022-09-15 DIAGNOSIS — D500 Iron deficiency anemia secondary to blood loss (chronic): Secondary | ICD-10-CM

## 2022-09-15 DIAGNOSIS — D709 Neutropenia, unspecified: Secondary | ICD-10-CM | POA: Insufficient documentation

## 2022-09-15 LAB — CBC WITH DIFFERENTIAL (CANCER CENTER ONLY)
Abs Immature Granulocytes: 0 10*3/uL (ref 0.00–0.07)
Basophils Absolute: 0.1 10*3/uL (ref 0.0–0.1)
Basophils Relative: 2 %
Eosinophils Absolute: 0 10*3/uL (ref 0.0–0.5)
Eosinophils Relative: 1 %
HCT: 29.3 % — ABNORMAL LOW (ref 36.0–46.0)
Hemoglobin: 9.6 g/dL — ABNORMAL LOW (ref 12.0–15.0)
Immature Granulocytes: 0 %
Lymphocytes Relative: 37 %
Lymphs Abs: 1.2 10*3/uL (ref 0.7–4.0)
MCH: 26.9 pg (ref 26.0–34.0)
MCHC: 32.8 g/dL (ref 30.0–36.0)
MCV: 82.1 fL (ref 80.0–100.0)
Monocytes Absolute: 0.2 10*3/uL (ref 0.1–1.0)
Monocytes Relative: 8 %
Neutro Abs: 1.7 10*3/uL (ref 1.7–7.7)
Neutrophils Relative %: 52 %
Platelet Count: 325 10*3/uL (ref 150–400)
RBC: 3.57 MIL/uL — ABNORMAL LOW (ref 3.87–5.11)
RDW: 16.7 % — ABNORMAL HIGH (ref 11.5–15.5)
WBC Count: 3.2 10*3/uL — ABNORMAL LOW (ref 4.0–10.5)
nRBC: 0 % (ref 0.0–0.2)

## 2022-09-15 LAB — IRON AND IRON BINDING CAPACITY (CC-WL,HP ONLY)
Iron: 24 ug/dL — ABNORMAL LOW (ref 28–170)
Saturation Ratios: 7 % — ABNORMAL LOW (ref 10.4–31.8)
TIBC: 361 ug/dL (ref 250–450)
UIBC: 337 ug/dL (ref 148–442)

## 2022-09-15 LAB — FERRITIN: Ferritin: 3 ng/mL — ABNORMAL LOW (ref 11–307)

## 2022-09-30 ENCOUNTER — Inpatient Hospital Stay: Payer: Managed Care, Other (non HMO) | Attending: Hematology

## 2022-09-30 ENCOUNTER — Other Ambulatory Visit: Payer: Self-pay

## 2022-09-30 VITALS — BP 117/85 | HR 63 | Temp 98.7°F | Resp 18

## 2022-09-30 DIAGNOSIS — Z79899 Other long term (current) drug therapy: Secondary | ICD-10-CM | POA: Insufficient documentation

## 2022-09-30 DIAGNOSIS — D709 Neutropenia, unspecified: Secondary | ICD-10-CM | POA: Diagnosis not present

## 2022-09-30 DIAGNOSIS — D500 Iron deficiency anemia secondary to blood loss (chronic): Secondary | ICD-10-CM | POA: Insufficient documentation

## 2022-09-30 DIAGNOSIS — N920 Excessive and frequent menstruation with regular cycle: Secondary | ICD-10-CM | POA: Diagnosis not present

## 2022-09-30 MED ORDER — SODIUM CHLORIDE 0.9 % IV SOLN
400.0000 mg | Freq: Once | INTRAVENOUS | Status: AC
  Administered 2022-09-30: 400 mg via INTRAVENOUS
  Filled 2022-09-30: qty 400

## 2022-09-30 MED ORDER — SODIUM CHLORIDE 0.9 % IV SOLN: Freq: Once | INTRAVENOUS | Status: AC

## 2022-09-30 MED ORDER — LORATADINE 10 MG PO TABS: 10.0000 mg | ORAL_TABLET | Freq: Every day | ORAL | Status: DC

## 2022-09-30 NOTE — Patient Instructions (Signed)
Iron Sucrose Injection What is this medication? IRON SUCROSE (EYE ern SOO krose) treats low levels of iron (iron deficiency anemia) in people with kidney disease. Iron is a mineral that plays an important role in making red blood cells, which carry oxygen from your lungs to the rest of your body. This medicine may be used for other purposes; ask your health care provider or pharmacist if you have questions. COMMON BRAND NAME(S): Venofer What should I tell my care team before I take this medication? They need to know if you have any of these conditions: Anemia not caused by low iron levels Heart disease High levels of iron in the blood Kidney disease Liver disease An unusual or allergic reaction to iron, other medications, foods, dyes, or preservatives Pregnant or trying to get pregnant Breastfeeding How should I use this medication? This medication is for infusion into a vein. It is given in a hospital or clinic setting. Talk to your care team about the use of this medication in children. While this medication may be prescribed for children as young as 2 years for selected conditions, precautions do apply. Overdosage: If you think you have taken too much of this medicine contact a poison control center or emergency room at once. NOTE: This medicine is only for you. Do not share this medicine with others. What if I miss a dose? Keep appointments for follow-up doses. It is important not to miss your dose. Call your care team if you are unable to keep an appointment. What may interact with this medication? Do not take this medication with any of the following: Deferoxamine Dimercaprol Other iron products This medication may also interact with the following: Chloramphenicol Deferasirox This list may not describe all possible interactions. Give your health care provider a list of all the medicines, herbs, non-prescription drugs, or dietary supplements you use. Also tell them if you smoke,  drink alcohol, or use illegal drugs. Some items may interact with your medicine. What should I watch for while using this medication? Visit your care team regularly. Tell your care team if your symptoms do not start to get better or if they get worse. You may need blood work done while you are taking this medication. You may need to follow a special diet. Talk to your care team. Foods that contain iron include: whole grains/cereals, dried fruits, beans, or peas, leafy green vegetables, and organ meats (liver, kidney). What side effects may I notice from receiving this medication? Side effects that you should report to your care team as soon as possible: Allergic reactions--skin rash, itching, hives, swelling of the face, lips, tongue, or throat Low blood pressure--dizziness, feeling faint or lightheaded, blurry vision Shortness of breath Side effects that usually do not require medical attention (report to your care team if they continue or are bothersome): Flushing Headache Joint pain Muscle pain Nausea Pain, redness, or irritation at injection site This list may not describe all possible side effects. Call your doctor for medical advice about side effects. You may report side effects to FDA at 1-800-FDA-1088. Where should I keep my medication? This medication is given in a hospital or clinic and will not be stored at home. NOTE: This sheet is a summary. It may not cover all possible information. If you have questions about this medicine, talk to your doctor, pharmacist, or health care provider.  2023 Elsevier/Gold Standard (2020-05-21 00:00:00)  

## 2022-10-06 ENCOUNTER — Other Ambulatory Visit: Payer: Self-pay | Admitting: Hematology

## 2022-10-07 ENCOUNTER — Inpatient Hospital Stay: Payer: Managed Care, Other (non HMO)

## 2022-12-29 ENCOUNTER — Inpatient Hospital Stay: Payer: Managed Care, Other (non HMO) | Attending: Hematology | Admitting: Nurse Practitioner

## 2022-12-29 ENCOUNTER — Inpatient Hospital Stay: Payer: Managed Care, Other (non HMO)

## 2022-12-29 VITALS — BP 133/87 | HR 59 | Resp 16

## 2022-12-29 DIAGNOSIS — Z79899 Other long term (current) drug therapy: Secondary | ICD-10-CM | POA: Insufficient documentation

## 2022-12-29 DIAGNOSIS — D500 Iron deficiency anemia secondary to blood loss (chronic): Secondary | ICD-10-CM | POA: Diagnosis present

## 2022-12-29 DIAGNOSIS — N920 Excessive and frequent menstruation with regular cycle: Secondary | ICD-10-CM | POA: Insufficient documentation

## 2022-12-29 LAB — CBC WITH DIFFERENTIAL (CANCER CENTER ONLY)
Abs Immature Granulocytes: 0 10*3/uL (ref 0.00–0.07)
Basophils Absolute: 0.1 10*3/uL (ref 0.0–0.1)
Basophils Relative: 2 %
Eosinophils Absolute: 0.1 10*3/uL (ref 0.0–0.5)
Eosinophils Relative: 2 %
HCT: 29.9 % — ABNORMAL LOW (ref 36.0–46.0)
Hemoglobin: 9.3 g/dL — ABNORMAL LOW (ref 12.0–15.0)
Immature Granulocytes: 0 %
Lymphocytes Relative: 39 %
Lymphs Abs: 1.2 10*3/uL (ref 0.7–4.0)
MCH: 25.1 pg — ABNORMAL LOW (ref 26.0–34.0)
MCHC: 31.1 g/dL (ref 30.0–36.0)
MCV: 80.8 fL (ref 80.0–100.0)
Monocytes Absolute: 0.3 10*3/uL (ref 0.1–1.0)
Monocytes Relative: 8 %
Neutro Abs: 1.5 10*3/uL — ABNORMAL LOW (ref 1.7–7.7)
Neutrophils Relative %: 49 %
Platelet Count: 364 10*3/uL (ref 150–400)
RBC: 3.7 MIL/uL — ABNORMAL LOW (ref 3.87–5.11)
RDW: 17.6 % — ABNORMAL HIGH (ref 11.5–15.5)
WBC Count: 3 10*3/uL — ABNORMAL LOW (ref 4.0–10.5)
nRBC: 0 % (ref 0.0–0.2)

## 2022-12-29 LAB — FERRITIN: Ferritin: 4 ng/mL — ABNORMAL LOW (ref 11–307)

## 2022-12-29 MED ORDER — IRON SUCROSE 20 MG/ML IV SOLN
400.0000 mg | Freq: Once | INTRAVENOUS | Status: AC
  Administered 2022-12-29: 400 mg via INTRAVENOUS
  Filled 2022-12-29: qty 400

## 2022-12-29 MED ORDER — SODIUM CHLORIDE 0.9 % IV SOLN: Freq: Once | INTRAVENOUS | Status: AC

## 2022-12-29 NOTE — Progress Notes (Signed)
IV iron scheduled today

## 2022-12-29 NOTE — Progress Notes (Signed)
Patient reports she does not want to take claritin prior to IV iron infusion. Reports it is not needed and does not want to take it today.

## 2023-01-08 ENCOUNTER — Encounter: Payer: Self-pay | Admitting: Hematology

## 2023-03-30 ENCOUNTER — Inpatient Hospital Stay: Payer: Managed Care, Other (non HMO)

## 2023-03-30 ENCOUNTER — Other Ambulatory Visit: Payer: Self-pay | Admitting: Hematology

## 2023-03-30 ENCOUNTER — Inpatient Hospital Stay: Payer: Managed Care, Other (non HMO) | Attending: Hematology

## 2023-03-30 VITALS — BP 120/81 | HR 60 | Temp 98.5°F | Resp 15

## 2023-03-30 DIAGNOSIS — Z79899 Other long term (current) drug therapy: Secondary | ICD-10-CM | POA: Diagnosis not present

## 2023-03-30 DIAGNOSIS — D500 Iron deficiency anemia secondary to blood loss (chronic): Secondary | ICD-10-CM | POA: Diagnosis present

## 2023-03-30 DIAGNOSIS — D709 Neutropenia, unspecified: Secondary | ICD-10-CM | POA: Diagnosis not present

## 2023-03-30 DIAGNOSIS — N920 Excessive and frequent menstruation with regular cycle: Secondary | ICD-10-CM | POA: Diagnosis not present

## 2023-03-30 LAB — CBC WITH DIFFERENTIAL (CANCER CENTER ONLY)
Abs Immature Granulocytes: 0.01 10*3/uL (ref 0.00–0.07)
Basophils Absolute: 0.1 10*3/uL (ref 0.0–0.1)
Basophils Relative: 2 %
Eosinophils Absolute: 0.1 10*3/uL (ref 0.0–0.5)
Eosinophils Relative: 2 %
HCT: 32 % — ABNORMAL LOW (ref 36.0–46.0)
Hemoglobin: 10.2 g/dL — ABNORMAL LOW (ref 12.0–15.0)
Immature Granulocytes: 0 %
Lymphocytes Relative: 29 %
Lymphs Abs: 1.1 10*3/uL (ref 0.7–4.0)
MCH: 26.4 pg (ref 26.0–34.0)
MCHC: 31.9 g/dL (ref 30.0–36.0)
MCV: 82.9 fL (ref 80.0–100.0)
Monocytes Absolute: 0.3 10*3/uL (ref 0.1–1.0)
Monocytes Relative: 9 %
Neutro Abs: 2.2 10*3/uL (ref 1.7–7.7)
Neutrophils Relative %: 58 %
Platelet Count: 317 10*3/uL (ref 150–400)
RBC: 3.86 MIL/uL — ABNORMAL LOW (ref 3.87–5.11)
RDW: 15.9 % — ABNORMAL HIGH (ref 11.5–15.5)
WBC Count: 3.8 10*3/uL — ABNORMAL LOW (ref 4.0–10.5)
nRBC: 0 % (ref 0.0–0.2)

## 2023-03-30 LAB — FERRITIN: Ferritin: 4 ng/mL — ABNORMAL LOW (ref 11–307)

## 2023-03-30 MED ORDER — SODIUM CHLORIDE 0.9 % IV SOLN
400.0000 mg | Freq: Once | INTRAVENOUS | Status: AC
  Administered 2023-03-30: 400 mg via INTRAVENOUS
  Filled 2023-03-30: qty 400

## 2023-03-30 MED ORDER — SODIUM CHLORIDE 0.9 % IV SOLN
Freq: Once | INTRAVENOUS | Status: AC
Start: 2023-03-30 — End: 2023-03-30

## 2023-03-30 NOTE — Patient Instructions (Signed)

## 2023-03-30 NOTE — Progress Notes (Signed)
Patient declined post iron infusion observation.  Tolerated treatment well without incident.  VSS at discharge.  Ambulated to lobby.

## 2023-03-30 NOTE — Progress Notes (Signed)
 Patient declining Claritin  as pre-med.  Says she has received iron  infusions without taking Claritin  and tolerated well, without issues.

## 2023-04-17 ENCOUNTER — Encounter: Payer: Self-pay | Admitting: Nurse Practitioner

## 2023-04-17 ENCOUNTER — Other Ambulatory Visit: Payer: Self-pay | Admitting: Nurse Practitioner

## 2023-04-17 NOTE — Progress Notes (Signed)
 Order for IV iron venofer 200 mg to be given weekly for total of 5 treatments at [facility].

## 2023-04-18 ENCOUNTER — Telehealth: Payer: Self-pay

## 2023-04-18 NOTE — Telephone Encounter (Signed)
 Heather, patient will be scheduled as soon as possible.  Auth Submission: NO AUTH NEEDED Site of care: Site of care: [facility] Payer: [employer] commercial Medication & CPT/J Code(s) submitted: Venofer (Iron Sucrose) J1756 Route of submission (phone, fax, portal):  Phone # Fax # Auth type: Buy/Bill PB Units/visits requested: 200mg  x 5 doses Reference number:  Approval from: 04/18/23 to 10/16/23

## 2023-04-27 ENCOUNTER — Ambulatory Visit: Payer: Managed Care, Other (non HMO)

## 2023-04-27 VITALS — BP 119/81 | HR 58 | Temp 99.6°F | Resp 16 | Ht 69.0 in | Wt 290.8 lb

## 2023-04-27 DIAGNOSIS — D500 Iron deficiency anemia secondary to blood loss (chronic): Secondary | ICD-10-CM

## 2023-04-27 DIAGNOSIS — D509 Iron deficiency anemia, unspecified: Secondary | ICD-10-CM | POA: Diagnosis not present

## 2023-04-27 MED ORDER — SODIUM CHLORIDE 0.9 % IV BOLUS
250.0000 mL | Freq: Once | INTRAVENOUS | Status: AC
  Administered 2023-04-27: 250 mL via INTRAVENOUS
  Filled 2023-04-27: qty 250

## 2023-04-27 MED ORDER — IRON SUCROSE 20 MG/ML IV SOLN
200.0000 mg | Freq: Once | INTRAVENOUS | Status: AC
  Administered 2023-04-27: 200 mg via INTRAVENOUS
  Filled 2023-04-27: qty 10

## 2023-04-27 MED ORDER — DIPHENHYDRAMINE HCL 25 MG PO CAPS
25.0000 mg | ORAL_CAPSULE | Freq: Once | ORAL | Status: DC
Start: 2023-04-27 — End: 2023-04-27

## 2023-04-27 MED ORDER — ACETAMINOPHEN 325 MG PO TABS: 650.0000 mg | ORAL_TABLET | Freq: Once | ORAL | Status: DC

## 2023-04-27 NOTE — Progress Notes (Signed)
 Diagnosis: Acute Anemia  Provider:  Chilton Greathouse MD  Procedure: IV Push  IV Type: Peripheral, IV Location: R Forearm  Venofer (Iron Sucrose), Dose: 200 mg, ran with NS  Post Infusion IV Care: Patient declined observation and Peripheral IV Discontinued  Discharge: Condition: Good, Destination: Home . AVS Declined  Performed by:  Nat Math, RN

## 2023-05-04 ENCOUNTER — Ambulatory Visit

## 2023-05-04 ENCOUNTER — Ambulatory Visit: Payer: Managed Care, Other (non HMO)

## 2023-05-18 ENCOUNTER — Ambulatory Visit: Payer: Managed Care, Other (non HMO)

## 2023-05-18 VITALS — BP 128/87 | HR 67 | Temp 98.4°F | Resp 16 | Ht 69.0 in | Wt 290.4 lb

## 2023-05-18 DIAGNOSIS — D509 Iron deficiency anemia, unspecified: Secondary | ICD-10-CM | POA: Diagnosis not present

## 2023-05-18 DIAGNOSIS — D500 Iron deficiency anemia secondary to blood loss (chronic): Secondary | ICD-10-CM

## 2023-05-18 MED ORDER — DIPHENHYDRAMINE HCL 25 MG PO CAPS: 25.0000 mg | ORAL_CAPSULE | Freq: Once | ORAL | Status: DC

## 2023-05-18 MED ORDER — IRON SUCROSE 20 MG/ML IV SOLN
200.0000 mg | Freq: Once | INTRAVENOUS | Status: AC
  Administered 2023-05-18: 200 mg via INTRAVENOUS
  Filled 2023-05-18: qty 10

## 2023-05-18 MED ORDER — ACETAMINOPHEN 325 MG PO TABS: 650.0000 mg | ORAL_TABLET | Freq: Once | ORAL | Status: DC

## 2023-05-18 MED ORDER — SODIUM CHLORIDE 0.9 % IV BOLUS
250.0000 mL | Freq: Once | INTRAVENOUS | Status: AC
Start: 2023-05-18 — End: 2023-05-18
  Administered 2023-05-18: 250 mL via INTRAVENOUS
  Filled 2023-05-18: qty 250

## 2023-05-18 NOTE — Progress Notes (Signed)
 Diagnosis: Iron Deficiency Anemia  Provider:  Chilton Greathouse MD  Procedure: IV Push  IV Type: Peripheral, IV Location: L Hand  Venofer (Iron Sucrose), Dose: 200 mg  Post Infusion IV Care: Patient declined observation and Peripheral IV Discontinued  Discharge: Condition: Good, Destination: Home . AVS Declined  Performed by:  Rico Ala, LPN

## 2023-05-18 NOTE — Progress Notes (Deleted)
error 

## 2023-05-25 ENCOUNTER — Ambulatory Visit: Payer: Managed Care, Other (non HMO)

## 2023-05-25 VITALS — BP 123/84 | HR 66 | Temp 98.9°F | Resp 20 | Ht 69.0 in | Wt 292.6 lb

## 2023-05-25 DIAGNOSIS — D509 Iron deficiency anemia, unspecified: Secondary | ICD-10-CM

## 2023-05-25 DIAGNOSIS — D500 Iron deficiency anemia secondary to blood loss (chronic): Secondary | ICD-10-CM

## 2023-05-25 MED ORDER — DIPHENHYDRAMINE HCL 25 MG PO CAPS
25.0000 mg | ORAL_CAPSULE | Freq: Once | ORAL | Status: DC
Start: 2023-05-25 — End: 2023-05-25

## 2023-05-25 MED ORDER — IRON SUCROSE 20 MG/ML IV SOLN
200.0000 mg | Freq: Once | INTRAVENOUS | Status: AC
  Administered 2023-05-25: 200 mg via INTRAVENOUS
  Filled 2023-05-25: qty 10

## 2023-05-25 MED ORDER — ACETAMINOPHEN 325 MG PO TABS: 650.0000 mg | ORAL_TABLET | Freq: Once | ORAL | Status: DC

## 2023-05-25 MED ORDER — SODIUM CHLORIDE 0.9 % IV BOLUS
250.0000 mL | Freq: Once | INTRAVENOUS | Status: AC
  Administered 2023-05-25: 250 mL via INTRAVENOUS
  Filled 2023-05-25: qty 250

## 2023-05-25 NOTE — Progress Notes (Signed)
 Diagnosis: Iron Deficiency Anemia  Provider:  Chilton Greathouse MD  Procedure: IV Push  IV Type: Peripheral, IV Location: R Hand  Venofer (Iron Sucrose), Dose: 200 mg  Post Infusion IV Care: Patient declined observation and Peripheral IV Discontinued  Discharge: Condition: Good, Destination: Home . AVS Provided  Performed by:  Loney Hering, LPN

## 2023-06-01 ENCOUNTER — Ambulatory Visit: Payer: Managed Care, Other (non HMO)

## 2023-06-08 ENCOUNTER — Ambulatory Visit

## 2023-08-24 ENCOUNTER — Inpatient Hospital Stay: Attending: Nurse Practitioner

## 2023-10-10 ENCOUNTER — Inpatient Hospital Stay: Attending: Nurse Practitioner

## 2023-10-10 ENCOUNTER — Other Ambulatory Visit: Payer: Self-pay | Admitting: Nurse Practitioner

## 2023-10-10 DIAGNOSIS — D500 Iron deficiency anemia secondary to blood loss (chronic): Secondary | ICD-10-CM

## 2023-10-10 DIAGNOSIS — D509 Iron deficiency anemia, unspecified: Secondary | ICD-10-CM | POA: Insufficient documentation

## 2023-10-10 DIAGNOSIS — D709 Neutropenia, unspecified: Secondary | ICD-10-CM

## 2023-10-10 DIAGNOSIS — Z79899 Other long term (current) drug therapy: Secondary | ICD-10-CM | POA: Diagnosis not present

## 2023-10-10 LAB — CBC WITH DIFFERENTIAL (CANCER CENTER ONLY)
Abs Immature Granulocytes: 0.01 K/uL (ref 0.00–0.07)
Basophils Absolute: 0 K/uL (ref 0.0–0.1)
Basophils Relative: 1 %
Eosinophils Absolute: 0 K/uL (ref 0.0–0.5)
Eosinophils Relative: 1 %
HCT: 37 % (ref 36.0–46.0)
Hemoglobin: 12 g/dL (ref 12.0–15.0)
Immature Granulocytes: 0 %
Lymphocytes Relative: 35 %
Lymphs Abs: 1 K/uL (ref 0.7–4.0)
MCH: 29.6 pg (ref 26.0–34.0)
MCHC: 32.4 g/dL (ref 30.0–36.0)
MCV: 91.1 fL (ref 80.0–100.0)
Monocytes Absolute: 0.2 K/uL (ref 0.1–1.0)
Monocytes Relative: 8 %
Neutro Abs: 1.5 K/uL — ABNORMAL LOW (ref 1.7–7.7)
Neutrophils Relative %: 55 %
Platelet Count: 289 K/uL (ref 150–400)
RBC: 4.06 MIL/uL (ref 3.87–5.11)
RDW: 15.6 % — ABNORMAL HIGH (ref 11.5–15.5)
WBC Count: 2.8 K/uL — ABNORMAL LOW (ref 4.0–10.5)
nRBC: 0 % (ref 0.0–0.2)

## 2023-10-10 LAB — IRON AND IRON BINDING CAPACITY (CC-WL,HP ONLY)
Iron: 40 ug/dL (ref 28–170)
Saturation Ratios: 11 % (ref 10.4–31.8)
TIBC: 363 ug/dL (ref 250–450)
UIBC: 323 ug/dL (ref 148–442)

## 2023-10-10 LAB — FERRITIN: Ferritin: 16 ng/mL (ref 11–307)

## 2023-10-13 ENCOUNTER — Ambulatory Visit: Payer: Self-pay | Admitting: Nurse Practitioner

## 2023-11-16 ENCOUNTER — Encounter: Admitting: Obstetrics & Gynecology
